# Patient Record
Sex: Male | Born: 1955
Health system: Southern US, Community
[De-identification: ages and names within clinical notes are randomized; demographics above are authoritative.]

## PROBLEM LIST (undated history)

## (undated) DIAGNOSIS — R1310 Dysphagia, unspecified: Secondary | ICD-10-CM

## (undated) DIAGNOSIS — I959 Hypotension, unspecified: Secondary | ICD-10-CM

## (undated) DIAGNOSIS — E559 Vitamin D deficiency, unspecified: Secondary | ICD-10-CM

## (undated) DIAGNOSIS — B029 Zoster without complications: Secondary | ICD-10-CM

## (undated) DIAGNOSIS — M6702 Short Achilles tendon (acquired), left ankle: Secondary | ICD-10-CM

## (undated) DIAGNOSIS — S82852B Displaced trimalleolar fracture of left lower leg, initial encounter for open fracture type I or II: Secondary | ICD-10-CM

## (undated) DIAGNOSIS — T8859XA Other complications of anesthesia, initial encounter: Secondary | ICD-10-CM

## (undated) DIAGNOSIS — R51 Headache: Secondary | ICD-10-CM

## (undated) DIAGNOSIS — M24572 Contracture, left ankle: Secondary | ICD-10-CM

## (undated) DIAGNOSIS — T4145XA Adverse effect of unspecified anesthetic, initial encounter: Secondary | ICD-10-CM

## (undated) DIAGNOSIS — R112 Nausea with vomiting, unspecified: Secondary | ICD-10-CM

## (undated) DIAGNOSIS — Z9889 Other specified postprocedural states: Secondary | ICD-10-CM

## (undated) DIAGNOSIS — J189 Pneumonia, unspecified organism: Secondary | ICD-10-CM

## (undated) DIAGNOSIS — R519 Headache, unspecified: Secondary | ICD-10-CM

## (undated) DIAGNOSIS — K219 Gastro-esophageal reflux disease without esophagitis: Secondary | ICD-10-CM

## (undated) DIAGNOSIS — A770 Spotted fever due to Rickettsia rickettsii: Secondary | ICD-10-CM

## (undated) DIAGNOSIS — E785 Hyperlipidemia, unspecified: Secondary | ICD-10-CM

## (undated) DIAGNOSIS — G009 Bacterial meningitis, unspecified: Secondary | ICD-10-CM

## (undated) DIAGNOSIS — A879 Viral meningitis, unspecified: Secondary | ICD-10-CM

## (undated) DIAGNOSIS — I499 Cardiac arrhythmia, unspecified: Secondary | ICD-10-CM

## (undated) DIAGNOSIS — G039 Meningitis, unspecified: Secondary | ICD-10-CM

## (undated) DIAGNOSIS — G960 Cerebrospinal fluid leak: Secondary | ICD-10-CM

## (undated) DIAGNOSIS — C801 Malignant (primary) neoplasm, unspecified: Secondary | ICD-10-CM

## (undated) DIAGNOSIS — S82302M Unspecified fracture of lower end of left tibia, subsequent encounter for open fracture type I or II with nonunion: Secondary | ICD-10-CM

## (undated) HISTORY — PX: LAMINECTOMY: SHX219

## (undated) HISTORY — DX: Cerebrospinal fluid leak: G96.0

## (undated) HISTORY — DX: Bacterial meningitis, unspecified: G00.9

## (undated) HISTORY — PX: ESOPHAGOGASTRODUODENOSCOPY: SHX1529

## (undated) HISTORY — DX: Spotted fever due to Rickettsia rickettsii: A77.0

## (undated) HISTORY — PX: KNEE ARTHROSCOPY WITH ANTERIOR CRUCIATE LIGAMENT (ACL) REPAIR: SHX5644

## (undated) HISTORY — DX: Viral meningitis, unspecified: A87.9

## (undated) HISTORY — PX: COLONOSCOPY: SHX174

## (undated) HISTORY — PX: ARTHROSCOPIC REPAIR ACL: SUR80

## (undated) HISTORY — PX: OTHER SURGICAL HISTORY: SHX169

## (undated) HISTORY — DX: Malignant (primary) neoplasm, unspecified: C80.1

---
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
## 2001-12-24 DIAGNOSIS — A879 Viral meningitis, unspecified: Secondary | ICD-10-CM

## 2001-12-24 HISTORY — DX: Viral meningitis, unspecified: A87.9

## 2004-12-24 DIAGNOSIS — G009 Bacterial meningitis, unspecified: Secondary | ICD-10-CM

## 2004-12-24 DIAGNOSIS — G9600 Cerebrospinal fluid leak, unspecified: Secondary | ICD-10-CM

## 2004-12-24 DIAGNOSIS — A770 Spotted fever due to Rickettsia rickettsii: Secondary | ICD-10-CM

## 2004-12-24 HISTORY — DX: Spotted fever due to Rickettsia rickettsii: A77.0

## 2004-12-24 HISTORY — DX: Bacterial meningitis, unspecified: G00.9

## 2004-12-24 HISTORY — DX: Cerebrospinal fluid leak, unspecified: G96.00

## 2005-08-25 ENCOUNTER — Emergency Department: Payer: Self-pay | Admitting: General Practice

## 2005-08-26 ENCOUNTER — Inpatient Hospital Stay: Payer: Self-pay

## 2005-09-12 ENCOUNTER — Ambulatory Visit: Payer: Self-pay | Admitting: Anesthesiology

## 2005-09-14 ENCOUNTER — Ambulatory Visit: Payer: Self-pay | Admitting: Anesthesiology

## 2007-04-08 ENCOUNTER — Ambulatory Visit: Payer: Self-pay | Admitting: General Practice

## 2007-04-29 ENCOUNTER — Ambulatory Visit: Payer: Self-pay | Admitting: General Practice

## 2007-07-10 ENCOUNTER — Ambulatory Visit: Payer: Self-pay | Admitting: General Practice

## 2008-01-20 ENCOUNTER — Ambulatory Visit: Payer: Self-pay | Admitting: General Practice

## 2008-03-12 ENCOUNTER — Encounter: Payer: Self-pay | Admitting: General Practice

## 2008-03-24 ENCOUNTER — Encounter: Payer: Self-pay | Admitting: General Practice

## 2008-04-23 ENCOUNTER — Encounter: Payer: Self-pay | Admitting: General Practice

## 2008-05-24 ENCOUNTER — Encounter: Payer: Self-pay | Admitting: General Practice

## 2009-02-25 ENCOUNTER — Ambulatory Visit: Payer: Self-pay | Admitting: Internal Medicine

## 2009-03-01 ENCOUNTER — Ambulatory Visit: Payer: Self-pay | Admitting: Internal Medicine

## 2009-03-11 ENCOUNTER — Ambulatory Visit: Payer: Self-pay | Admitting: General Practice

## 2009-03-16 ENCOUNTER — Ambulatory Visit: Payer: Self-pay | Admitting: General Practice

## 2009-03-16 ENCOUNTER — Ambulatory Visit: Payer: Self-pay | Admitting: Internal Medicine

## 2012-12-23 ENCOUNTER — Ambulatory Visit: Payer: Self-pay | Admitting: Family Medicine

## 2013-05-28 ENCOUNTER — Ambulatory Visit: Payer: Self-pay | Admitting: Orthopedic Surgery

## 2014-07-06 ENCOUNTER — Ambulatory Visit: Payer: Self-pay | Admitting: General Practice

## 2014-07-29 ENCOUNTER — Encounter: Payer: Self-pay | Admitting: *Deleted

## 2014-08-10 ENCOUNTER — Ambulatory Visit: Payer: Self-pay | Admitting: General Surgery

## 2014-08-16 ENCOUNTER — Ambulatory Visit (INDEPENDENT_AMBULATORY_CARE_PROVIDER_SITE_OTHER): Payer: BC Managed Care – PPO | Admitting: General Surgery

## 2014-08-16 ENCOUNTER — Encounter: Payer: Self-pay | Admitting: General Surgery

## 2014-08-16 VITALS — BP 116/80 | HR 68 | Resp 14 | Ht 71.5 in | Wt 190.0 lb

## 2014-08-16 DIAGNOSIS — G8929 Other chronic pain: Secondary | ICD-10-CM

## 2014-08-16 DIAGNOSIS — R1031 Right lower quadrant pain: Secondary | ICD-10-CM

## 2014-08-16 NOTE — Patient Instructions (Signed)
Patient advised to try Ibuprofen for 2 weeks to see if he has any relief. Patient to call with a report to our office in 2 weeks with an update of how he is doing. The patient is aware to call back for any questions or concerns.

## 2014-08-16 NOTE — Progress Notes (Signed)
Patient ID: Randy Bennett, male   DOB: 29-Oct-1956, 58 y.o.   MRN: [id number]  Chief Complaint  Patient presents with  . Abdominal Pain    HPI Randy Bennett is a 58 y.o. male here today for abdominal pain . Patient had a ct scan done on 07/06/14. He states the pain started approximately 3 months ago. The pain is located in the right lower quadrant just below his beltline. He states the pain comes and goes. He notices it with movement especially when bending over. He states it has gotten worse. He describes the pain as someone punching him in the stomach. He states it takes his breathe away at times. Bowels are moving normally. No weight loss.  No known injuries. 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   The patient is thought to a developed a CSF leak after multiple lumbar punctures during an episode of Bacterial meningitis in 2006. He subsequently was treated at [facility] Where he underwent 2 different laminectomies. By the patient's report, there is thought to be persistent leak from the L3 area. HPI  Past Medical History  Diagnosis Date  . Viral meningitis 2003  . Bacterial meningitis 2006  . [place] spotted fever 2006  . CSF leak 2006  . Hypertension   . Cancer     basil cell skin 2000-2001    Past Surgical History  Procedure Laterality Date  . Appendectomy  1970  . Laminectomy  [id number]  . Knee arthroscopy with anterior cruciate ligament (acl) repair Bilateral 1993 (L), 1992 (R)  . Skin cancer removal  2000-2001    Family History  Problem Relation Age of Onset  . Cancer Mother     Social History History  Substance Use Topics  . Smoking status: Never Smoker   . Smokeless tobacco: Never Used  . Alcohol Use: No    Allergies  Allergen Reactions  . Lodine [Etodolac] Rash  . Prednisolone Rash    Current Outpatient Prescriptions  Medication Sig Dispense Refill  . atorvastatin (LIPITOR) 20 MG tablet  Take 20 mg by mouth daily at 6 PM.      . cetirizine (ZYRTEC) 10 MG tablet Take 10 mg by mouth daily.      Marland Kitchen ibuprofen (ADVIL,MOTRIN) 800 MG tablet Take 800 mg by mouth every 8 (eight) hours as needed.      . Multiple Vitamin (MULTIVITAMIN) tablet Take 1 tablet by mouth daily.       No current facility-administered medications for this visit.    Review of Systems Review of Systems  Constitutional: Negative.   Respiratory: Negative.   Cardiovascular: Negative.   Gastrointestinal: Negative.     Blood pressure 116/80, pulse 68, resp. rate 14, height 5' 11.5" (1.816 m), weight 190 lb (86.183 kg).  Physical Exam Physical Exam  Constitutional: He is oriented to person, place, and time. He appears well-developed and well-nourished.  Neck: Neck supple. No thyromegaly present.  Cardiovascular: Normal rate, regular rhythm and normal heart sounds.   No murmur heard. Pulmonary/Chest: Effort normal and breath sounds normal.  Abdominal: Soft. Normal appearance and bowel sounds are normal. There is no hepatosplenomegaly. There is no tenderness. No hernia.  Lymphadenopathy:    He has no cervical adenopathy.  Neurological: He is alert and oriented to person, place, and time.  Skin: Skin is warm and dry.  The area of discomfort was marked both in the supine and standing position. This is just medial to  the right anterior superior iliac spine, well superior to the anticipated course of the inguinal canal. This is in the T10 nerve root distribution.  Data Reviewed The CT of the abdomen and pelvis on July 06, 2014 was reviewed with special attention to the right lower quadrant. No evidence of muscular defect, hernia or underlying mass could be appreciated. Coronary calcifications were identified. The patient has recently been started on Lipitor. (Cholesterol 177, tumors right 103, HDL low at 35. Elevated LDL of 121. Average cardiac risk.  Laboratory studies dated July 26, 2014 showed a normal Random  metabolic panel, CBC with a white blood cell count 6001 underwent normal differential, hemoglobin 15.2 and MCV of 91. Normal platelet count 264,000.  PCP note reviewed.  Assessment    Intermittent right lower quadrant pain without clear abnormality on clinical exam or recent imaging.     Plan    The patient has tolerated ibuprofen in the past. He makes use of about 800 mg daily for his headaches thought secondary to a chronic CSF leak. He is amenable to a trial of ibuprofen, 800 mg t.i.d. For 2 weeks to see if this improves his abdominal pain. He's been asked to give a phone followup in this regard. If he doesn't show significant improvement, consideration could be directed towards diagnostic laparoscopy. At this time, the patient is not excited about surgical intervention.    PCP: Jayme Cloud 08/17/2014, 2:35 PM

## 2014-08-17 DIAGNOSIS — R1031 Right lower quadrant pain: Principal | ICD-10-CM

## 2014-08-17 DIAGNOSIS — G8929 Other chronic pain: Secondary | ICD-10-CM | POA: Insufficient documentation

## 2015-05-31 DIAGNOSIS — R1310 Dysphagia, unspecified: Secondary | ICD-10-CM | POA: Insufficient documentation

## 2015-05-31 DIAGNOSIS — Z86010 Personal history of colonic polyps: Secondary | ICD-10-CM | POA: Insufficient documentation

## 2015-07-01 ENCOUNTER — Encounter
Admission: RE | Disposition: A | Discharge status: Home / Self Care | Payer: Self-pay | Source: Ambulatory Visit | Attending: Unknown Physician Specialty

## 2015-07-01 ENCOUNTER — Ambulatory Visit
Admission: RE | Admit: 2015-07-01 | Discharge: 2015-07-01 | Disposition: A | Discharge status: Home / Self Care | Payer: BLUE CROSS/BLUE SHIELD | Source: Ambulatory Visit | Attending: Unknown Physician Specialty | Admitting: Unknown Physician Specialty

## 2015-07-01 ENCOUNTER — Ambulatory Visit: Payer: BLUE CROSS/BLUE SHIELD | Admitting: Anesthesiology

## 2015-07-01 ENCOUNTER — Encounter: Payer: Self-pay | Admitting: *Deleted

## 2015-07-01 DIAGNOSIS — I10 Essential (primary) hypertension: Secondary | ICD-10-CM | POA: Diagnosis not present

## 2015-07-01 DIAGNOSIS — Z79899 Other long term (current) drug therapy: Secondary | ICD-10-CM | POA: Insufficient documentation

## 2015-07-01 DIAGNOSIS — Z888 Allergy status to other drugs, medicaments and biological substances status: Secondary | ICD-10-CM | POA: Diagnosis not present

## 2015-07-01 DIAGNOSIS — R1310 Dysphagia, unspecified: Secondary | ICD-10-CM | POA: Diagnosis not present

## 2015-07-01 DIAGNOSIS — D122 Benign neoplasm of ascending colon: Secondary | ICD-10-CM | POA: Diagnosis not present

## 2015-07-01 DIAGNOSIS — Z85828 Personal history of other malignant neoplasm of skin: Secondary | ICD-10-CM | POA: Diagnosis not present

## 2015-07-01 DIAGNOSIS — K210 Gastro-esophageal reflux disease with esophagitis: Secondary | ICD-10-CM | POA: Diagnosis not present

## 2015-07-01 DIAGNOSIS — Z86010 Personal history of colonic polyps: Secondary | ICD-10-CM | POA: Diagnosis present

## 2015-07-01 DIAGNOSIS — R197 Diarrhea, unspecified: Secondary | ICD-10-CM | POA: Insufficient documentation

## 2015-07-01 DIAGNOSIS — K640 First degree hemorrhoids: Secondary | ICD-10-CM | POA: Diagnosis not present

## 2015-07-01 HISTORY — DX: Meningitis, unspecified: G03.9

## 2015-07-01 HISTORY — PX: ESOPHAGOGASTRODUODENOSCOPY: SHX5428

## 2015-07-01 HISTORY — DX: Dysphagia, unspecified: R13.10

## 2015-07-01 HISTORY — DX: Headache: R51

## 2015-07-01 HISTORY — DX: Zoster without complications: B02.9

## 2015-07-01 HISTORY — PX: SAVORY DILATION: SHX5439

## 2015-07-01 HISTORY — DX: Headache, unspecified: R51.9

## 2015-07-01 HISTORY — PX: COLONOSCOPY WITH PROPOFOL: SHX5780

## 2015-07-01 SURGERY — COLONOSCOPY WITH PROPOFOL
Anesthesia: General

## 2015-07-01 MED ORDER — PROPOFOL 10 MG/ML IV BOLUS
INTRAVENOUS | Status: DC | PRN
  Administered 2015-07-01: 50 mg via INTRAVENOUS

## 2015-07-01 MED ORDER — BUTAMBEN-TETRACAINE-BENZOCAINE 2-2-14 % EX AERO
INHALATION_SPRAY | CUTANEOUS | Status: DC | PRN
  Administered 2015-07-01: 2 via TOPICAL

## 2015-07-01 MED ORDER — LIDOCAINE HCL (PF) 2 % IJ SOLN
INTRAMUSCULAR | Status: DC | PRN
  Administered 2015-07-01: 50 mg

## 2015-07-01 MED ORDER — SODIUM CHLORIDE 0.9 % IV SOLN
INTRAVENOUS | Status: DC
  Administered 2015-07-01: 1000 mL via INTRAVENOUS

## 2015-07-01 MED ORDER — FENTANYL CITRATE (PF) 100 MCG/2ML IJ SOLN
INTRAMUSCULAR | Status: DC | PRN
  Administered 2015-07-01: 50 ug via INTRAVENOUS

## 2015-07-01 MED ORDER — PROPOFOL INFUSION 10 MG/ML OPTIME
INTRAVENOUS | Status: DC | PRN
  Administered 2015-07-01: 140 ug/kg/min via INTRAVENOUS

## 2015-07-01 MED ORDER — SODIUM CHLORIDE 0.9 % IV SOLN: INTRAVENOUS | Status: DC

## 2015-07-01 MED ORDER — GLYCOPYRROLATE 0.2 MG/ML IJ SOLN
INTRAMUSCULAR | Status: DC | PRN
  Administered 2015-07-01: 0.2 mg via INTRAVENOUS

## 2015-07-01 MED ORDER — MIDAZOLAM HCL 5 MG/5ML IJ SOLN
INTRAMUSCULAR | Status: DC | PRN
  Administered 2015-07-01: 1 mg via INTRAVENOUS

## 2015-07-01 NOTE — Anesthesia Postprocedure Evaluation (Signed)
  Anesthesia Post-op Note  Patient: Randy Bennett  Procedure(s) Performed: Procedure(s): COLONOSCOPY WITH PROPOFOL (N/A) ESOPHAGOGASTRODUODENOSCOPY (EGD) (N/A) SAVORY DILATION (N/A)  Anesthesia type:General  Patient location: PACU  Post pain: Pain level controlled  Post assessment: Post-op Vital signs reviewed, Patient's Cardiovascular Status Stable, Respiratory Function Stable, Patent Airway and No signs of Nausea or vomiting  Post vital signs: Reviewed and stable  Last Vitals:  Filed Vitals:   07/01/15 1210  BP: 116/85  Pulse: 77  Temp:   Resp: 15    Level of consciousness: awake, alert  and patient cooperative  Complications: No apparent anesthesia complications

## 2015-07-01 NOTE — Op Note (Signed)
[facility] Gastroenterology Patient Name: Randy Bennett Procedure Date: 07/01/2015 10:59 AM MRN: [id number] Account #: [id number] Date of Birth: May 02, 1956 Admit Type: Outpatient Age: 59 Room: [facility] ENDO ROOM 1 Gender: Male Note Status: Finalized Procedure:         Colonoscopy Indications:       High risk colon cancer surveillance: Personal history of                     colonic polyps Providers:         Manya Silvas, MD Medicines:         Propofol per Anesthesia Complications:     No immediate complications. Procedure:         Pre-Anesthesia Assessment:                    - After reviewing the risks and benefits, the patient was                     deemed in satisfactory condition to undergo the procedure.                    After obtaining informed consent, the colonoscope was                     passed under direct vision. Throughout the procedure, the                     patient's blood pressure, pulse, and oxygen saturations                     were monitored continuously. The Colonoscope was                     introduced through the anus and advanced to the the cecum,                     identified by appendiceal orifice and ileocecal valve. The                     colonoscopy was performed without difficulty. The patient                     tolerated the procedure well. The quality of the bowel                     preparation was excellent. Findings:      A diminutive polyp was found in the ascending colon. The polyp was       sessile. The polyp was removed with a jumbo cold forceps. Resection and       retrieval were complete.      Internal hemorrhoids were found during endoscopy. The hemorrhoids were       small, medium-sized and Grade I (internal hemorrhoids that do not       prolapse).      The exam was otherwise without abnormality. Impression:        - One diminutive polyp in the ascending colon. Resected                     and  retrieved.                    - Internal hemorrhoids.                    -  The examination was otherwise normal. Recommendation:    - Await pathology results. Probably repeat in 5 years.                     Continue Imodium as needed. Manya Silvas, MD 07/01/2015 11:48:25 AM This report has been signed electronically. Number of Addenda: 0 Note Initiated On: 07/01/2015 10:59 AM Scope Withdrawal Time: 0 hours 9 minutes 46 seconds  Total Procedure Duration: 0 hours 16 minutes 59 seconds       [facility]

## 2015-07-01 NOTE — H&P (Signed)
Primary Care Physician:  Everlean Alstrom, MD Primary Gastroenterologist:  Dr. Vira Agar  Pre-Procedure History & Physical: HPI:  Randy Bennett is a 59 y.o. male is here for an endoscopy and colonoscopy.   Past Medical History  Diagnosis Date  . Viral meningitis 2003  . Bacterial meningitis 2006  . Rocky Mountain spotted fever 2006  . CSF leak 2006  . Hypertension   . Cancer     basil cell skin 2000-2001  . Headache   . Shingles   . Meningitis   . Dysphagia     Past Surgical History  Procedure Laterality Date  . Appendectomy  1970  . Laminectomy  [id number]  . Knee arthroscopy with anterior cruciate ligament (acl) repair Bilateral 1993 (L), 1992 (R)  . Skin cancer removal  2000-2001  . Colonoscopy    . Esophagogastroduodenoscopy      Prior to Admission medications   Medication Sig Start Date End Date Taking? Authorizing Provider  diazepam (VALIUM) 5 MG tablet Take 5 mg by mouth every 6 (six) hours as needed for anxiety.   Yes Historical Provider, MD  HYDROmorphone (DILAUDID) 2 MG tablet Take 2 mg by mouth every 4 (four) hours as needed for severe pain.   Yes Historical Provider, MD  ondansetron (ZOFRAN) 4 MG tablet Take 4 mg by mouth every 8 (eight) hours as needed for nausea or vomiting.   Yes Historical Provider, MD  atorvastatin (LIPITOR) 20 MG tablet Take 20 mg by mouth daily at 6 PM.    Historical Provider, MD  cetirizine (ZYRTEC) 10 MG tablet Take 10 mg by mouth daily.    Historical Provider, MD  ibuprofen (ADVIL,MOTRIN) 800 MG tablet Take 800 mg by mouth every 8 (eight) hours as needed.    Historical Provider, MD  Multiple Vitamin (MULTIVITAMIN) tablet Take 1 tablet by mouth daily.    Historical Provider, MD    Allergies as of 06/13/2015 - Review Complete 08/16/2014  Allergen Reaction Noted  . Lodine [etodolac] Rash 08/16/2014  . Prednisolone Rash 08/16/2014    Family History  Problem Relation Age of Onset  . Cancer Mother     History   Social  History  . Marital Status: Married    Spouse Name: N/A  . Number of Children: N/A  . Years of Education: N/A   Occupational History  . Not on file.   Social History Main Topics  . Smoking status: Never Smoker   . Smokeless tobacco: Never Used  . Alcohol Use: No  . Drug Use: No  . Sexual Activity: Not on file   Other Topics Concern  . Not on file   Social History Narrative    Review of Systems: See HPI, otherwise negative ROS  Physical Exam: BP 132/86 mmHg  Pulse 83  Temp(Src) 98.3 F (36.8 C) (Tympanic)  Resp 21  Ht 5\' 11"  (1.803 m)  Wt 83.915 kg (185 lb)  BMI 25.81 kg/m2  SpO2 100% General:   Alert,  pleasant and cooperative in NAD Head:  Normocephalic and atraumatic. Neck:  Supple; no masses or thyromegaly. Lungs:  Clear throughout to auscultation.    Heart:  Regular rate and rhythm. Abdomen:  Soft, nontender and nondistended. Normal bowel sounds, without guarding, and without rebound.   Neurologic:  Alert and  oriented x4;  grossly normal neurologically.  Impression/Plan: Randy Bennett is here for an endoscopy and colonoscopy to be performed for dysphagia and previous adenoma and diarrhea.  Risks, benefits, limitations, and alternatives regarding  endoscopy and colonoscopy have been reviewed with the patient.  Questions have been answered.  All parties agreeable.   Gaylyn Cheers, MD  07/01/2015, 11:11 AM   Primary Care Physician:  Everlean Alstrom, MD Primary Gastroenterologist:  Dr. Vira Agar  Pre-Procedure History & Physical: HPI:  Randy Bennett is a 59 y.o. male is here for an endoscopy and colonoscopy.   Past Medical History  Diagnosis Date  . Viral meningitis 2003  . Bacterial meningitis 2006  . Rocky Mountain spotted fever 2006  . CSF leak 2006  . Hypertension   . Cancer     basil cell skin 2000-2001  . Headache   . Shingles   . Meningitis   . Dysphagia     Past Surgical History  Procedure Laterality Date  . Appendectomy   1970  . Laminectomy  [id number]  . Knee arthroscopy with anterior cruciate ligament (acl) repair Bilateral 1993 (L), 1992 (R)  . Skin cancer removal  2000-2001  . Colonoscopy    . Esophagogastroduodenoscopy      Prior to Admission medications   Medication Sig Start Date End Date Taking? Authorizing Provider  diazepam (VALIUM) 5 MG tablet Take 5 mg by mouth every 6 (six) hours as needed for anxiety.   Yes Historical Provider, MD  HYDROmorphone (DILAUDID) 2 MG tablet Take 2 mg by mouth every 4 (four) hours as needed for severe pain.   Yes Historical Provider, MD  ondansetron (ZOFRAN) 4 MG tablet Take 4 mg by mouth every 8 (eight) hours as needed for nausea or vomiting.   Yes Historical Provider, MD  atorvastatin (LIPITOR) 20 MG tablet Take 20 mg by mouth daily at 6 PM.    Historical Provider, MD  cetirizine (ZYRTEC) 10 MG tablet Take 10 mg by mouth daily.    Historical Provider, MD  ibuprofen (ADVIL,MOTRIN) 800 MG tablet Take 800 mg by mouth every 8 (eight) hours as needed.    Historical Provider, MD  Multiple Vitamin (MULTIVITAMIN) tablet Take 1 tablet by mouth daily.    Historical Provider, MD    Allergies as of 06/13/2015 - Review Complete 08/16/2014  Allergen Reaction Noted  . Lodine [etodolac] Rash 08/16/2014  . Prednisolone Rash 08/16/2014    Family History  Problem Relation Age of Onset  . Cancer Mother     History   Social History  . Marital Status: Married    Spouse Name: N/A  . Number of Children: N/A  . Years of Education: N/A   Occupational History  . Not on file.   Social History Main Topics  . Smoking status: Never Smoker   . Smokeless tobacco: Never Used  . Alcohol Use: No  . Drug Use: No  . Sexual Activity: Not on file   Other Topics Concern  . Not on file   Social History Narrative    Review of Systems: See HPI, otherwise negative ROS  Physical Exam: BP 132/86 mmHg  Pulse 83  Temp(Src) 98.3 F (36.8 C) (Tympanic)  Resp 21  Ht 5\' 11"  (1.803  m)  Wt 83.915 kg (185 lb)  BMI 25.81 kg/m2  SpO2 100% General:   Alert,  pleasant and cooperative in NAD Head:  Normocephalic and atraumatic. Neck:  Supple; no masses or thyromegaly. Lungs:  Clear throughout to auscultation.    Heart:  Regular rate and rhythm. Abdomen:  Soft, nontender and nondistended. Normal bowel sounds, without guarding, and without rebound.   Neurologic:  Alert and  oriented x4;  grossly normal neurologically.  Impression/Plan: Randy Bennett is here for an endoscopy and colonoscopy to be performed for for previous colon polyps and diarrhea and dysphagia.  Risks, benefits, limitations, and alternatives regarding  endoscopy and colonoscopy have been reviewed with the patient.  Questions have been answered.  All parties agreeable.   Gaylyn Cheers, MD  07/01/2015, 11:11 AM

## 2015-07-01 NOTE — Anesthesia Preprocedure Evaluation (Signed)
Anesthesia Evaluation  Patient identified by MRN, date of birth, ID band Patient awake    Reviewed: Allergy & Precautions, NPO status , Patient's Chart, lab work & pertinent test results  Airway Mallampati: I  TM Distance: >3 FB Neck ROM: Full    Dental  (+) Teeth Intact   Pulmonary    Pulmonary exam normal       Cardiovascular negative cardio ROS Normal cardiovascular exam    Neuro/Psych Chronic headaches since presenting with meningitis secondary to rocky mountain spotted fever. He has a persistent CSF leak.    GI/Hepatic   Endo/Other    Renal/GU      Musculoskeletal   Abdominal Normal abdominal exam  (+)   Peds  Hematology   Anesthesia Other Findings   Reproductive/Obstetrics                             Anesthesia Physical Anesthesia Plan  ASA: II  Anesthesia Plan: General   Post-op Pain Management:    Induction: Intravenous  Airway Management Planned: Nasal Cannula  Additional Equipment:   Intra-op Plan:   Post-operative Plan:   Informed Consent: I have reviewed the patients History and Physical, chart, labs and discussed the procedure including the risks, benefits and alternatives for the proposed anesthesia with the patient or authorized representative who has indicated his/her understanding and acceptance.     Plan Discussed with: CRNA  Anesthesia Plan Comments:         Anesthesia Quick Evaluation

## 2015-07-01 NOTE — Transfer of Care (Signed)
Immediate Anesthesia Transfer of Care Note  Patient: Randy Bennett  Procedure(s) Performed: Procedure(s): COLONOSCOPY WITH PROPOFOL (N/A) ESOPHAGOGASTRODUODENOSCOPY (EGD) (N/A) SAVORY DILATION (N/A)  Patient Location: PACU  Anesthesia Type:General  Level of Consciousness: sedated  Airway & Oxygen Therapy: Patient Spontanous Breathing and Patient connected to nasal cannula oxygen  Post-op Assessment: Report given to RN and Post -op Vital signs reviewed and stable  Post vital signs: Reviewed and stable  Last Vitals:  Filed Vitals:   07/01/15 1036  BP: 132/86  Pulse: 83  Temp: 36.8 C  Resp: 21    Complications: No apparent anesthesia complications

## 2015-07-01 NOTE — Op Note (Signed)
[facility] Gastroenterology Patient Name: Randy Bennett Procedure Date: 07/01/2015 11:06 AM MRN: [id number] Account #: [id number] Date of Birth: 18-Apr-1956 Admit Type: Outpatient Age: 59 Room: [facility] ENDO ROOM 1 Gender: Male Note Status: Finalized Procedure:         Upper GI endoscopy Indications:       Dysphagia Providers:         Manya Silvas, MD Medicines:         Propofol per Anesthesia Complications:     No immediate complications. Procedure:         Pre-Anesthesia Assessment:                    - After reviewing the risks and benefits, the patient was                     deemed in satisfactory condition to undergo the procedure.                    After obtaining informed consent, the endoscope was passed                     under direct vision. Throughout the procedure, the                     patient's blood pressure, pulse, and oxygen saturations                     were monitored continuously. The Endoscope was introduced                     through the mouth, and advanced to the second part of                     duodenum. The upper GI endoscopy was accomplished without                     difficulty. The patient tolerated the procedure well. Findings:      Esophagitis with no bleeding was found 42-44 cm from the incisors.       Biopsies were taken with a cold forceps for histology. to rule out       Barretts esophagus.      The stomach was normal.      The examined duodenum was normal. Impression:        - Reflux esophagitis. Rule out Barrett's esophagus.                     Biopsied.                    - Normal stomach.                    - Normal examined duodenum. Recommendation:    - Await pathology results. Manya Silvas, MD 07/01/2015 11:25:30 AM This report has been signed electronically. Number of Addenda: 0 Note Initiated On: 07/01/2015 11:06 AM      [facility]

## 2015-07-04 ENCOUNTER — Encounter: Payer: Self-pay | Admitting: Unknown Physician Specialty

## 2015-07-04 LAB — SURGICAL PATHOLOGY

## 2015-11-08 ENCOUNTER — Other Ambulatory Visit: Payer: Self-pay | Admitting: Physician Assistant

## 2015-11-08 DIAGNOSIS — G9600 Cerebrospinal fluid leak, unspecified: Secondary | ICD-10-CM

## 2015-11-08 DIAGNOSIS — R51 Headache: Principal | ICD-10-CM

## 2015-11-08 DIAGNOSIS — R519 Headache, unspecified: Secondary | ICD-10-CM

## 2015-11-08 DIAGNOSIS — G8929 Other chronic pain: Secondary | ICD-10-CM

## 2015-11-22 ENCOUNTER — Encounter (HOSPITAL_COMMUNITY)
Admission: EM | Disposition: A | Discharge status: Home / Self Care | Payer: Self-pay | Source: Home / Self Care | Attending: Orthopedic Surgery

## 2015-11-22 ENCOUNTER — Emergency Department (HOSPITAL_COMMUNITY): Payer: BLUE CROSS/BLUE SHIELD | Admitting: Anesthesiology

## 2015-11-22 ENCOUNTER — Encounter (HOSPITAL_COMMUNITY): Payer: Self-pay | Admitting: *Deleted

## 2015-11-22 ENCOUNTER — Inpatient Hospital Stay (HOSPITAL_COMMUNITY)
Admission: EM | Admit: 2015-11-22 | Discharge: 2015-11-30 | DRG: 493 | Disposition: A | Discharge status: Home / Self Care | Payer: BLUE CROSS/BLUE SHIELD | Attending: Orthopedic Surgery | Admitting: Orthopedic Surgery

## 2015-11-22 ENCOUNTER — Emergency Department (HOSPITAL_COMMUNITY): Payer: BLUE CROSS/BLUE SHIELD

## 2015-11-22 ENCOUNTER — Inpatient Hospital Stay (HOSPITAL_COMMUNITY): Payer: BLUE CROSS/BLUE SHIELD

## 2015-11-22 DIAGNOSIS — I10 Essential (primary) hypertension: Secondary | ICD-10-CM | POA: Diagnosis present

## 2015-11-22 DIAGNOSIS — G9600 Cerebrospinal fluid leak, unspecified: Secondary | ICD-10-CM | POA: Diagnosis present

## 2015-11-22 DIAGNOSIS — E785 Hyperlipidemia, unspecified: Secondary | ICD-10-CM | POA: Diagnosis present

## 2015-11-22 DIAGNOSIS — S82873A Displaced pilon fracture of unspecified tibia, initial encounter for closed fracture: Secondary | ICD-10-CM

## 2015-11-22 DIAGNOSIS — Z8661 Personal history of infections of the central nervous system: Secondary | ICD-10-CM

## 2015-11-22 DIAGNOSIS — S82899B Other fracture of unspecified lower leg, initial encounter for open fracture type I or II: Secondary | ICD-10-CM | POA: Diagnosis present

## 2015-11-22 DIAGNOSIS — S82202B Unspecified fracture of shaft of left tibia, initial encounter for open fracture type I or II: Secondary | ICD-10-CM

## 2015-11-22 DIAGNOSIS — B370 Candidal stomatitis: Secondary | ICD-10-CM | POA: Diagnosis not present

## 2015-11-22 DIAGNOSIS — I808 Phlebitis and thrombophlebitis of other sites: Secondary | ICD-10-CM | POA: Diagnosis present

## 2015-11-22 DIAGNOSIS — Z23 Encounter for immunization: Secondary | ICD-10-CM | POA: Diagnosis not present

## 2015-11-22 DIAGNOSIS — Z888 Allergy status to other drugs, medicaments and biological substances status: Secondary | ICD-10-CM | POA: Diagnosis not present

## 2015-11-22 DIAGNOSIS — R52 Pain, unspecified: Secondary | ICD-10-CM

## 2015-11-22 DIAGNOSIS — K219 Gastro-esophageal reflux disease without esophagitis: Secondary | ICD-10-CM | POA: Diagnosis present

## 2015-11-22 DIAGNOSIS — W1789XA Other fall from one level to another, initial encounter: Secondary | ICD-10-CM

## 2015-11-22 DIAGNOSIS — R339 Retention of urine, unspecified: Secondary | ICD-10-CM | POA: Diagnosis not present

## 2015-11-22 DIAGNOSIS — E559 Vitamin D deficiency, unspecified: Secondary | ICD-10-CM | POA: Diagnosis present

## 2015-11-22 DIAGNOSIS — Z419 Encounter for procedure for purposes other than remedying health state, unspecified: Secondary | ICD-10-CM

## 2015-11-22 DIAGNOSIS — D62 Acute posthemorrhagic anemia: Secondary | ICD-10-CM | POA: Diagnosis not present

## 2015-11-22 DIAGNOSIS — S82302B Unspecified fracture of lower end of left tibia, initial encounter for open fracture type I or II: Secondary | ICD-10-CM | POA: Diagnosis present

## 2015-11-22 DIAGNOSIS — W11XXXA Fall on and from ladder, initial encounter: Secondary | ICD-10-CM | POA: Diagnosis present

## 2015-11-22 DIAGNOSIS — Z79899 Other long term (current) drug therapy: Secondary | ICD-10-CM | POA: Diagnosis not present

## 2015-11-22 DIAGNOSIS — S82892B Other fracture of left lower leg, initial encounter for open fracture type I or II: Secondary | ICD-10-CM

## 2015-11-22 DIAGNOSIS — S82872A Displaced pilon fracture of left tibia, initial encounter for closed fracture: Secondary | ICD-10-CM

## 2015-11-22 DIAGNOSIS — S82852B Displaced trimalleolar fracture of left lower leg, initial encounter for open fracture type I or II: Secondary | ICD-10-CM | POA: Diagnosis present

## 2015-11-22 HISTORY — DX: Displaced trimalleolar fracture of left lower leg, initial encounter for open fracture type I or II: S82.852B

## 2015-11-22 HISTORY — DX: Hyperlipidemia, unspecified: E78.5

## 2015-11-22 HISTORY — PX: EXTERNAL FIXATION LEG: SHX1549

## 2015-11-22 HISTORY — DX: Gastro-esophageal reflux disease without esophagitis: K21.9

## 2015-11-22 HISTORY — DX: Vitamin D deficiency, unspecified: E55.9

## 2015-11-22 HISTORY — PX: ANKLE FRACTURE SURGERY: SHX122

## 2015-11-22 LAB — CBC
HCT: 44 % (ref 39.0–52.0)
Hemoglobin: 14.9 g/dL (ref 13.0–17.0)
MCH: 30.6 pg (ref 26.0–34.0)
MCHC: 33.9 g/dL (ref 30.0–36.0)
MCV: 90.3 fL (ref 78.0–100.0)
Platelets: 285 10*3/uL (ref 150–400)
RBC: 4.87 MIL/uL (ref 4.22–5.81)
RDW: 12.8 % (ref 11.5–15.5)
WBC: 14.4 10*3/uL — ABNORMAL HIGH (ref 4.0–10.5)

## 2015-11-22 LAB — BASIC METABOLIC PANEL
Anion gap: 11 (ref 5–15)
BUN: 17 mg/dL (ref 6–20)
CO2: 21 mmol/L — ABNORMAL LOW (ref 22–32)
Calcium: 9.3 mg/dL (ref 8.9–10.3)
Chloride: 104 mmol/L (ref 101–111)
Creatinine, Ser: 1.55 mg/dL — ABNORMAL HIGH (ref 0.61–1.24)
GFR calc Af Amer: 55 mL/min — ABNORMAL LOW (ref >60)
GFR calc non Af Amer: 48 mL/min — ABNORMAL LOW (ref >60)
Glucose, Bld: 149 mg/dL — ABNORMAL HIGH (ref 65–99)
Potassium: 3.4 mmol/L — ABNORMAL LOW (ref 3.5–5.1)
Sodium: 136 mmol/L (ref 135–145)

## 2015-11-22 LAB — TYPE AND SCREEN
ABO/RH(D): O POS
Antibody Screen: NEGATIVE

## 2015-11-22 LAB — PROTIME-INR
INR: 1.12 (ref 0.00–1.49)
Prothrombin Time: 14.6 seconds (ref 11.6–15.2)

## 2015-11-22 LAB — ABO/RH: ABO/RH(D): O POS

## 2015-11-22 SURGERY — EXTERNAL FIXATION, LOWER EXTREMITY
Anesthesia: General | Site: Leg Lower | Laterality: Left

## 2015-11-22 MED ORDER — FENTANYL CITRATE (PF) 100 MCG/2ML IJ SOLN
INTRAMUSCULAR | Status: AC
  Filled 2015-11-22: qty 2

## 2015-11-22 MED ORDER — ONDANSETRON HCL 4 MG/2ML IJ SOLN
INTRAMUSCULAR | Status: DC | PRN
  Administered 2015-11-22: 4 mg via INTRAVENOUS

## 2015-11-22 MED ORDER — HYDROMORPHONE HCL 1 MG/ML IJ SOLN
INTRAMUSCULAR | Status: AC
  Administered 2015-11-22: 1 mg
  Filled 2015-11-22: qty 1

## 2015-11-22 MED ORDER — CEFAZOLIN SODIUM-DEXTROSE 2-3 GM-% IV SOLR
2.0000 g | Freq: Once | INTRAVENOUS | Status: AC
  Administered 2015-11-22: 2 g via INTRAVENOUS
  Filled 2015-11-22: qty 50

## 2015-11-22 MED ORDER — ONDANSETRON HCL 4 MG/2ML IJ SOLN
INTRAMUSCULAR | Status: AC
  Filled 2015-11-22: qty 2

## 2015-11-22 MED ORDER — LACTATED RINGERS IV SOLN
INTRAVENOUS | Status: DC
  Administered 2015-11-23 – 2015-11-24 (×2): via INTRAVENOUS

## 2015-11-22 MED ORDER — ONDANSETRON HCL 4 MG PO TABS: 4.0000 mg | ORAL_TABLET | Freq: Three times a day (TID) | ORAL | Status: DC | PRN

## 2015-11-22 MED ORDER — ROCURONIUM BROMIDE 50 MG/5ML IV SOLN
INTRAVENOUS | Status: AC
  Filled 2015-11-22: qty 1

## 2015-11-22 MED ORDER — SUFENTANIL CITRATE 50 MCG/ML IV SOLN
INTRAVENOUS | Status: DC | PRN
  Administered 2015-11-22: 10 ug via INTRAVENOUS

## 2015-11-22 MED ORDER — FENTANYL CITRATE (PF) 100 MCG/2ML IJ SOLN
25.0000 ug | INTRAMUSCULAR | Status: DC | PRN
  Administered 2015-11-22 (×3): 50 ug via INTRAVENOUS
  Filled 2015-11-22 (×2): qty 1

## 2015-11-22 MED ORDER — ACETAMINOPHEN 650 MG RE SUPP: 650.0000 mg | Freq: Four times a day (QID) | RECTAL | Status: DC | PRN

## 2015-11-22 MED ORDER — CEFAZOLIN SODIUM-DEXTROSE 2-3 GM-% IV SOLR: 2.0000 g | INTRAVENOUS | Status: DC

## 2015-11-22 MED ORDER — MIDAZOLAM HCL 2 MG/2ML IJ SOLN
INTRAMUSCULAR | Status: AC
  Filled 2015-11-22: qty 2

## 2015-11-22 MED ORDER — NEOSTIGMINE METHYLSULFATE 10 MG/10ML IV SOLN
INTRAVENOUS | Status: DC | PRN
  Administered 2015-11-22: 3 mg via INTRAVENOUS

## 2015-11-22 MED ORDER — LACTATED RINGERS IV SOLN
INTRAVENOUS | Status: DC | PRN
  Administered 2015-11-22 (×2): via INTRAVENOUS

## 2015-11-22 MED ORDER — HYDROMORPHONE HCL 2 MG PO TABS
2.0000 mg | ORAL_TABLET | Freq: Once | ORAL | Status: AC
  Administered 2015-11-22: 2 mg via ORAL

## 2015-11-22 MED ORDER — DEXAMETHASONE SODIUM PHOSPHATE 4 MG/ML IJ SOLN
INTRAMUSCULAR | Status: AC
  Filled 2015-11-22: qty 1

## 2015-11-22 MED ORDER — ATORVASTATIN CALCIUM 20 MG PO TABS
20.0000 mg | ORAL_TABLET | Freq: Every day | ORAL | Status: DC
  Administered 2015-11-23 – 2015-11-29 (×6): 20 mg via ORAL
  Filled 2015-11-22 (×6): qty 1

## 2015-11-22 MED ORDER — FENTANYL CITRATE (PF) 100 MCG/2ML IJ SOLN
INTRAMUSCULAR | Status: AC
  Administered 2015-11-22: 16:00:00
  Filled 2015-11-22: qty 2

## 2015-11-22 MED ORDER — HYDROMORPHONE HCL 1 MG/ML IJ SOLN
1.0000 mg | Freq: Once | INTRAMUSCULAR | Status: AC
  Administered 2015-11-22: 1 mg via INTRAVENOUS
  Filled 2015-11-22: qty 1

## 2015-11-22 MED ORDER — OXYCODONE HCL 5 MG PO TABS
ORAL_TABLET | ORAL | Status: AC
  Administered 2015-11-22: 5 mg
  Filled 2015-11-22: qty 1

## 2015-11-22 MED ORDER — EPHEDRINE SULFATE 50 MG/ML IJ SOLN
INTRAMUSCULAR | Status: DC | PRN
  Administered 2015-11-22: 10 mg via INTRAVENOUS

## 2015-11-22 MED ORDER — LIDOCAINE HCL (CARDIAC) 20 MG/ML IV SOLN
INTRAVENOUS | Status: DC | PRN
  Administered 2015-11-22: 100 mg via INTRAVENOUS

## 2015-11-22 MED ORDER — PROPOFOL 10 MG/ML IV BOLUS
INTRAVENOUS | Status: AC
  Administered 2015-11-22: 70 mg via INTRAVENOUS
  Filled 2015-11-22: qty 20

## 2015-11-22 MED ORDER — SODIUM CHLORIDE 0.9 % IR SOLN
Status: DC | PRN
  Administered 2015-11-22: 3000 mL

## 2015-11-22 MED ORDER — FENTANYL CITRATE (PF) 250 MCG/5ML IJ SOLN
INTRAMUSCULAR | Status: AC
  Filled 2015-11-22: qty 5

## 2015-11-22 MED ORDER — ONDANSETRON HCL 4 MG/2ML IJ SOLN
4.0000 mg | Freq: Once | INTRAMUSCULAR | Status: AC
  Administered 2015-11-22: 4 mg via INTRAVENOUS
  Filled 2015-11-22: qty 2

## 2015-11-22 MED ORDER — EPHEDRINE SULFATE 50 MG/ML IJ SOLN
INTRAMUSCULAR | Status: AC
  Filled 2015-11-22: qty 1

## 2015-11-22 MED ORDER — HYDROMORPHONE HCL 1 MG/ML IJ SOLN
1.0000 mg | INTRAMUSCULAR | Status: DC | PRN
  Administered 2015-11-22: 2 mg via INTRAVENOUS
  Administered 2015-11-23: 1 mg via INTRAVENOUS
  Administered 2015-11-23 (×2): 2 mg via INTRAVENOUS
  Administered 2015-11-24: 1 mg via INTRAVENOUS
  Administered 2015-11-24 – 2015-11-25 (×3): 2 mg via INTRAVENOUS
  Administered 2015-11-28 (×2): 1 mg via INTRAVENOUS
  Administered 2015-11-28 (×2): 2 mg via INTRAVENOUS
  Administered 2015-11-29: 1 mg via INTRAVENOUS
  Filled 2015-11-22 (×2): qty 2
  Filled 2015-11-22: qty 1
  Filled 2015-11-22: qty 2
  Filled 2015-11-22: qty 1
  Filled 2015-11-22 (×3): qty 2
  Filled 2015-11-22: qty 1
  Filled 2015-11-22 (×2): qty 2
  Filled 2015-11-22: qty 1
  Filled 2015-11-22: qty 2

## 2015-11-22 MED ORDER — FENTANYL CITRATE (PF) 100 MCG/2ML IJ SOLN
25.0000 ug | INTRAMUSCULAR | Status: DC | PRN
  Administered 2015-11-22 (×3): 50 ug via INTRAVENOUS

## 2015-11-22 MED ORDER — SUFENTANIL CITRATE 50 MCG/ML IV SOLN
INTRAVENOUS | Status: AC
  Filled 2015-11-22: qty 1

## 2015-11-22 MED ORDER — LORATADINE 10 MG PO TABS
10.0000 mg | ORAL_TABLET | Freq: Every day | ORAL | Status: DC
Start: 2015-11-23 — End: 2015-11-30
  Administered 2015-11-28: 10 mg via ORAL
  Filled 2015-11-22 (×7): qty 1

## 2015-11-22 MED ORDER — TEMAZEPAM 15 MG PO CAPS
15.0000 mg | ORAL_CAPSULE | Freq: Every evening | ORAL | Status: DC | PRN
  Administered 2015-11-23: 30 mg via ORAL
  Filled 2015-11-22: qty 2

## 2015-11-22 MED ORDER — ONDANSETRON HCL 4 MG PO TABS: 4.0000 mg | ORAL_TABLET | Freq: Four times a day (QID) | ORAL | Status: DC | PRN

## 2015-11-22 MED ORDER — HYDROMORPHONE HCL 1 MG/ML IJ SOLN
INTRAMUSCULAR | Status: AC
  Filled 2015-11-22: qty 1

## 2015-11-22 MED ORDER — LACTATED RINGERS IV SOLN
INTRAVENOUS | Status: DC
  Administered 2015-11-22: 19:00:00 via INTRAVENOUS

## 2015-11-22 MED ORDER — ROCURONIUM BROMIDE 100 MG/10ML IV SOLN
INTRAVENOUS | Status: DC | PRN
  Administered 2015-11-22: 30 mg via INTRAVENOUS

## 2015-11-22 MED ORDER — GLYCOPYRROLATE 0.2 MG/ML IJ SOLN
INTRAMUSCULAR | Status: DC | PRN
  Administered 2015-11-22: 0.4 mg via INTRAVENOUS

## 2015-11-22 MED ORDER — SODIUM CHLORIDE 0.9 % IJ SOLN
INTRAMUSCULAR | Status: AC
  Filled 2015-11-22: qty 10

## 2015-11-22 MED ORDER — GLYCOPYRROLATE 0.2 MG/ML IJ SOLN
INTRAMUSCULAR | Status: AC
  Filled 2015-11-22: qty 2

## 2015-11-22 MED ORDER — ONDANSETRON HCL 4 MG/2ML IJ SOLN
4.0000 mg | Freq: Four times a day (QID) | INTRAMUSCULAR | Status: DC | PRN
  Administered 2015-11-22: 4 mg via INTRAVENOUS
  Filled 2015-11-22: qty 2

## 2015-11-22 MED ORDER — ACETAMINOPHEN 325 MG PO TABS: 650.0000 mg | ORAL_TABLET | Freq: Four times a day (QID) | ORAL | Status: DC | PRN

## 2015-11-22 MED ORDER — DIAZEPAM 5 MG PO TABS
5.0000 mg | ORAL_TABLET | Freq: Four times a day (QID) | ORAL | Status: DC | PRN
  Administered 2015-11-28 – 2015-11-30 (×7): 5 mg via ORAL
  Filled 2015-11-22 (×7): qty 1

## 2015-11-22 MED ORDER — OXYCODONE HCL 5 MG/5ML PO SOLN: 5.0000 mg | Freq: Once | ORAL | Status: DC | PRN

## 2015-11-22 MED ORDER — METHOCARBAMOL 500 MG PO TABS
500.0000 mg | ORAL_TABLET | Freq: Four times a day (QID) | ORAL | Status: DC | PRN
  Administered 2015-11-23 – 2015-11-24 (×4): 500 mg via ORAL
  Filled 2015-11-22 (×4): qty 1

## 2015-11-22 MED ORDER — ENOXAPARIN SODIUM 40 MG/0.4ML ~~LOC~~ SOLN
40.0000 mg | SUBCUTANEOUS | Status: DC
  Administered 2015-11-23 – 2015-11-24 (×2): 40 mg via SUBCUTANEOUS
  Filled 2015-11-22 (×2): qty 0.4

## 2015-11-22 MED ORDER — ONDANSETRON HCL 4 MG/2ML IJ SOLN
4.0000 mg | Freq: Once | INTRAMUSCULAR | Status: AC | PRN
  Administered 2015-11-22: 4 mg via INTRAVENOUS

## 2015-11-22 MED ORDER — SUCCINYLCHOLINE CHLORIDE 20 MG/ML IJ SOLN
INTRAMUSCULAR | Status: DC | PRN
  Administered 2015-11-22: 100 mg via INTRAVENOUS

## 2015-11-22 MED ORDER — FENTANYL CITRATE (PF) 100 MCG/2ML IJ SOLN
INTRAMUSCULAR | Status: DC | PRN
  Administered 2015-11-22 (×2): 100 ug via INTRAVENOUS
  Administered 2015-11-22: 50 ug via INTRAVENOUS

## 2015-11-22 MED ORDER — METHOCARBAMOL 1000 MG/10ML IJ SOLN
500.0000 mg | Freq: Four times a day (QID) | INTRAVENOUS | Status: DC | PRN
  Administered 2015-11-23: 500 mg via INTRAVENOUS
  Filled 2015-11-22 (×3): qty 5

## 2015-11-22 MED ORDER — HYDROMORPHONE HCL 1 MG/ML IJ SOLN
INTRAMUSCULAR | Status: AC | PRN
  Administered 2015-11-22 (×2): 1 mg via INTRAVENOUS

## 2015-11-22 MED ORDER — HYDROMORPHONE HCL 2 MG PO TABS
ORAL_TABLET | ORAL | Status: AC
  Filled 2015-11-22: qty 1

## 2015-11-22 MED ORDER — SODIUM CHLORIDE 0.9 % IV BOLUS (SEPSIS)
1000.0000 mL | Freq: Once | INTRAVENOUS | Status: AC
  Administered 2015-11-22: 1000 mL via INTRAVENOUS

## 2015-11-22 MED ORDER — MIDAZOLAM HCL 5 MG/5ML IJ SOLN
INTRAMUSCULAR | Status: DC | PRN
  Administered 2015-11-22: 2 mg via INTRAVENOUS

## 2015-11-22 MED ORDER — LIDOCAINE HCL (CARDIAC) 20 MG/ML IV SOLN
INTRAVENOUS | Status: AC
  Filled 2015-11-22: qty 5

## 2015-11-22 MED ORDER — HYDROMORPHONE HCL 2 MG PO TABS
2.0000 mg | ORAL_TABLET | ORAL | Status: DC | PRN
  Administered 2015-11-23 – 2015-11-24 (×5): 4 mg via ORAL
  Filled 2015-11-22 (×5): qty 2

## 2015-11-22 MED ORDER — SUCCINYLCHOLINE CHLORIDE 20 MG/ML IJ SOLN
INTRAMUSCULAR | Status: AC
  Filled 2015-11-22: qty 1

## 2015-11-22 MED ORDER — METHOCARBAMOL 1000 MG/10ML IJ SOLN
500.0000 mg | INTRAMUSCULAR | Status: AC
Start: 2015-11-22 — End: 2015-11-22
  Administered 2015-11-22: 500 mg via INTRAVENOUS
  Filled 2015-11-22: qty 5

## 2015-11-22 MED ORDER — PROPOFOL 10 MG/ML IV BOLUS
0.5000 mg/kg | Freq: Once | INTRAVENOUS | Status: AC
  Administered 2015-11-22: 70 mg via INTRAVENOUS

## 2015-11-22 MED ORDER — OXYCODONE HCL 5 MG PO TABS: 5.0000 mg | ORAL_TABLET | Freq: Once | ORAL | Status: DC | PRN

## 2015-11-22 MED ORDER — CEFAZOLIN SODIUM-DEXTROSE 2-3 GM-% IV SOLR
2.0000 g | Freq: Three times a day (TID) | INTRAVENOUS | Status: AC
  Administered 2015-11-23 – 2015-11-24 (×6): 2 g via INTRAVENOUS
  Filled 2015-11-22 (×6): qty 50

## 2015-11-22 MED ORDER — CHLORHEXIDINE GLUCONATE 4 % EX LIQD: 60.0000 mL | Freq: Once | CUTANEOUS | Status: DC

## 2015-11-22 MED ORDER — 0.9 % SODIUM CHLORIDE (POUR BTL) OPTIME
TOPICAL | Status: DC | PRN
  Administered 2015-11-22: 1000 mL

## 2015-11-22 MED ORDER — PROPOFOL 10 MG/ML IV BOLUS
INTRAVENOUS | Status: DC | PRN
  Administered 2015-11-22: 100 mg via INTRAVENOUS
  Administered 2015-11-22: 200 mg via INTRAVENOUS

## 2015-11-22 MED ORDER — CEFAZOLIN SODIUM-DEXTROSE 2-3 GM-% IV SOLR
INTRAVENOUS | Status: DC | PRN
  Administered 2015-11-22: 2 g via INTRAVENOUS

## 2015-11-22 SURGICAL SUPPLY — 77 items
BANDAGE ELASTIC 4 VELCRO ST LF (GAUZE/BANDAGES/DRESSINGS) ×3 IMPLANT
BANDAGE ELASTIC 6 VELCRO ST LF (GAUZE/BANDAGES/DRESSINGS) ×3 IMPLANT
BANDAGE ESMARK 6X9 LF (GAUZE/BANDAGES/DRESSINGS) ×2 IMPLANT
BAR 11X100 BAR ×6 IMPLANT
BAR GLASS FIBER EXFX 11X100 (Bar) ×6 IMPLANT
BAR GLASS FIBER EXFX 11X400 (EXFIX) ×6 IMPLANT
BLADE SURG 10 STRL SS (BLADE) ×3 IMPLANT
BLADE SURG 15 STRL LF DISP TIS (BLADE) ×2 IMPLANT
BLADE SURG 15 STRL SS (BLADE) ×1
BLADE SURG ROTATE 9660 (MISCELLANEOUS) IMPLANT
BNDG COHESIVE 4X5 TAN STRL (GAUZE/BANDAGES/DRESSINGS) ×3 IMPLANT
BNDG ESMARK 6X9 LF (GAUZE/BANDAGES/DRESSINGS) ×3
BNDG GAUZE ELAST 4 BULKY (GAUZE/BANDAGES/DRESSINGS) ×3 IMPLANT
BOVIE (MISCELLANEOUS) ×3 IMPLANT
CLAMP BLUE BAR TO BAR (MISCELLANEOUS) ×6 IMPLANT
CLAMP BLUE BAR TO PIN (MISCELLANEOUS) ×12 IMPLANT
COVER MAYO STAND STRL (DRAPES) ×3 IMPLANT
COVER SURGICAL LIGHT HANDLE (MISCELLANEOUS) ×3 IMPLANT
CUFF TOURNIQUET SINGLE 34IN LL (TOURNIQUET CUFF) ×3 IMPLANT
CUFF TOURNIQUET SINGLE 44IN (TOURNIQUET CUFF) IMPLANT
DRAPE C-ARM 42X72 X-RAY (DRAPES) ×3 IMPLANT
DRAPE C-ARMOR (DRAPES) ×3 IMPLANT
DRAPE INCISE IOBAN 66X45 STRL (DRAPES) ×3 IMPLANT
DRAPE OEC MINIVIEW 54X84 (DRAPES) IMPLANT
DRAPE ORTHO SPLIT 77X108 STRL (DRAPES) ×2
DRAPE PROXIMA HALF (DRAPES) ×6 IMPLANT
DRAPE SURG ORHT 6 SPLT 77X108 (DRAPES) ×4 IMPLANT
DRAPE U-SHAPE 47X51 STRL (DRAPES) ×3 IMPLANT
DRSG ADAPTIC 3X8 NADH LF (GAUZE/BANDAGES/DRESSINGS) ×3 IMPLANT
DRSG PAD ABDOMINAL 8X10 ST (GAUZE/BANDAGES/DRESSINGS) ×3 IMPLANT
DURAPREP 26ML APPLICATOR (WOUND CARE) ×3 IMPLANT
ELECT REM PT RETURN 9FT ADLT (ELECTROSURGICAL) ×3
ELECTRODE REM PT RTRN 9FT ADLT (ELECTROSURGICAL) ×2 IMPLANT
GAUZE SPONGE 4X4 12PLY STRL (GAUZE/BANDAGES/DRESSINGS) ×3 IMPLANT
GAUZE XEROFORM 1X8 LF (GAUZE/BANDAGES/DRESSINGS) IMPLANT
GAUZE XEROFORM 5X9 LF (GAUZE/BANDAGES/DRESSINGS) ×3 IMPLANT
GLOVE BIOGEL PI IND STRL 8 (GLOVE) ×4 IMPLANT
GLOVE BIOGEL PI INDICATOR 8 (GLOVE) ×2
GLOVE ECLIPSE 7.5 STRL STRAW (GLOVE) ×6 IMPLANT
GOWN BRE IMP PREV XXLGXLNG (GOWN DISPOSABLE) ×3 IMPLANT
GOWN L4 XXLG W/PAP TWL (GOWN DISPOSABLE) ×3 IMPLANT
GOWN STRL REUS W/ TWL LRG LVL3 (GOWN DISPOSABLE) ×2 IMPLANT
GOWN STRL REUS W/ TWL XL LVL3 (GOWN DISPOSABLE) ×4 IMPLANT
GOWN STRL REUS W/TWL LRG LVL3 (GOWN DISPOSABLE) ×1
GOWN STRL REUS W/TWL XL LVL3 (GOWN DISPOSABLE) ×2
HALF PIN 3MM (PIN) ×3 IMPLANT
K-WIRE DBL TROCAR .062X4 ×3 IMPLANT
KIT BASIN OR (CUSTOM PROCEDURE TRAY) ×3 IMPLANT
KIT ROOM TURNOVER OR (KITS) ×3 IMPLANT
KWIRE DBL TROCAR .062X4 ×2 IMPLANT
MANIFOLD NEPTUNE II (INSTRUMENTS) ×3 IMPLANT
NS IRRIG 1000ML POUR BTL (IV SOLUTION) ×3 IMPLANT
PACK ORTHO EXTREMITY (CUSTOM PROCEDURE TRAY) ×3 IMPLANT
PACK SURGICAL SETUP 50X90 (CUSTOM PROCEDURE TRAY) ×3 IMPLANT
PAD ARMBOARD 7.5X6 YLW CONV (MISCELLANEOUS) ×6 IMPLANT
PAD CAST 4YDX4 CTTN HI CHSV (CAST SUPPLIES) ×2 IMPLANT
PADDING CAST COTTON 4X4 STRL (CAST SUPPLIES) ×1
PIN 3MM (PIN) ×3 IMPLANT
PIN CLAMP 2BAR 75MM BLUE (PIN) ×3 IMPLANT
PIN HALF YELLOW 5X160X35 (PIN) ×3 IMPLANT
PIN TRANSFIXING 5.0 (PIN) ×3 IMPLANT
SLEEVE SURGEON STRL (DRAPES) ×3 IMPLANT
SPONGE LAP 18X18 X RAY DECT (DISPOSABLE) ×3 IMPLANT
SPONGE LAP 4X18 X RAY DECT (DISPOSABLE) ×6 IMPLANT
STAPLER VISISTAT 35W (STAPLE) IMPLANT
STOCKINETTE IMPERVIOUS 9X36 MD (GAUZE/BANDAGES/DRESSINGS) ×3 IMPLANT
SUCTION FRAZIER TIP 10 FR DISP (SUCTIONS) ×3 IMPLANT
SUT ETHILON 2 0 FS 18 (SUTURE) ×3 IMPLANT
SUT ETHILON 4 0 PS 2 18 (SUTURE) IMPLANT
SUT VIC AB 0 CTB1 27 (SUTURE) IMPLANT
SUT VIC AB 2-0 FS1 27 (SUTURE) IMPLANT
SYR CONTROL 10ML LL (SYRINGE) IMPLANT
TOWEL OR 17X24 6PK STRL BLUE (TOWEL DISPOSABLE) ×3 IMPLANT
TOWEL OR 17X26 10 PK STRL BLUE (TOWEL DISPOSABLE) ×3 IMPLANT
TUBE CONNECTING 12X1/4 (SUCTIONS) ×3 IMPLANT
WATER STERILE IRR 1000ML POUR (IV SOLUTION) ×3 IMPLANT
YANKAUER SUCT BULB TIP NO VENT (SUCTIONS) IMPLANT

## 2015-11-22 NOTE — ED Notes (Signed)
Ortho Tech enroute to ED for sedation and realignment of LLE. Wife at bedside and signed sedation and procedure consent

## 2015-11-22 NOTE — Sedation Documentation (Signed)
Present during the sedation, reduction and setting of the left leg was Dr. Ashok Cordia, Dr. Ivor Reining, Wellington Hampshire., RN head nurse, Phillis Haggis., RN, Larey Seat, EMT, Harriet Masson, Ortho Tech, a

## 2015-11-22 NOTE — ED Provider Notes (Addendum)
ORTHOPEDIC INJURY TREATMENT Date/Time: 11/22/2015 5:14 PM Performed by: Gustavus Bryant Authorized by: Gustavus Bryant Consent: Written consent obtained. Consent given by: patient Time out: Immediately prior to procedure a "time out" was called to verify the correct patient, procedure, equipment, support staff and site/side marked as required. Injury location: lower leg Pre-procedure neurovascular assessment: neurovascularly intact Pre-procedure distal perfusion: normal Pre-procedure neurological function: normal Pre-procedure range of motion: reduced Patient sedated: yes Sedation type: moderate (conscious) sedation Sedatives: propofol Immobilization: splint (distal traction applied to LLE with manipulation of open distal tibia into anatomic alignment) Supplies used: Ortho-Glass Post-procedure neurovascular assessment: post-procedure neurovascularly intact Post-procedure distal perfusion: normal Post-procedure neurological function: normal Post-procedure range of motion: improved Patient tolerance: Patient tolerated the procedure well with no immediate complications   Supervised by Dr. Ashok Cordia.  Sedation time: 15 minutes.  Pt returned to normal alertness.    Gustavus Bryant, MD 11/22/15 1716   I saw and evaluated the patient, reviewed the resident's note and I agree with the findings and plan. See H and P by me.  Lajean Saver, MD 11/22/15 [place], MD 11/22/15 1801  Lajean Saver, MD 12/12/15 [phone]

## 2015-11-22 NOTE — H&P (Signed)
PREOPERATIVE H&P  Chief Complaint: Open distal tib-fib fractureLeft.  HPI: Randy Bennett is a 59 y.o. male who presents for evaluation of Left distal ankle fracture. It has been present for Several hours and has been worsening. He has failed conservative measures. Pain is rated as severe.The patient has not had previous history of problems with the ankle.  He fell off a ladder and suffered a severe injury and went to emergency room where he was evaluated and we were consulted.  Past Medical History  Diagnosis Date  . Viral meningitis 2003  . Bacterial meningitis 2006  . [place] spotted fever 2006  . CSF leak 2006  . Hypertension   . Cancer ([place])     basil cell skin 2000-2001  . Headache   . Shingles   . Meningitis   . Dysphagia    Past Surgical History  Procedure Laterality Date  . Appendectomy  1970  . Laminectomy  [id number]  . Knee arthroscopy with anterior cruciate ligament (acl) repair Bilateral 1993 (L), 1992 (R)  . Skin cancer removal  2000-2001  . Colonoscopy    . Esophagogastroduodenoscopy    . Colonoscopy with propofol N/A 07/01/2015    Procedure: COLONOSCOPY WITH PROPOFOL;  Surgeon: Manya Silvas, MD;  Location: [facility] ENDOSCOPY;  Service: Endoscopy;  Laterality: N/A;  . Esophagogastroduodenoscopy N/A 07/01/2015    Procedure: ESOPHAGOGASTRODUODENOSCOPY (EGD);  Surgeon: Manya Silvas, MD;  Location: [facility] ENDOSCOPY;  Service: Endoscopy;  Laterality: N/A;  . Savory dilation N/A 07/01/2015    Procedure: SAVORY DILATION;  Surgeon: Manya Silvas, MD;  Location: [facility] ENDOSCOPY;  Service: Endoscopy;  Laterality: N/A;   Social History   Social History  . Marital Status: Married    Spouse Name: N/A  . Number of Children: N/A  . Years of Education: N/A   Social History Main Topics  . Smoking status: Never Smoker   . Smokeless tobacco: Never Used  . Alcohol Use: No  . Drug Use: No  . Sexual Activity: Not Asked   Other Topics Concern  . None   Social  History Narrative   Family History  Problem Relation Age of Onset  . Cancer Mother    Allergies  Allergen Reactions  . Lodine [Etodolac] Rash  . Prednisolone Rash   Prior to Admission medications   Medication Sig Start Date End Date Taking? Authorizing Provider  atorvastatin (LIPITOR) 20 MG tablet Take 20 mg by mouth daily at 6 PM.    Historical Provider, MD  cetirizine (ZYRTEC) 10 MG tablet Take 10 mg by mouth daily.    Historical Provider, MD  diazepam (VALIUM) 5 MG tablet Take 5 mg by mouth every 6 (six) hours as needed for anxiety.    Historical Provider, MD  HYDROmorphone (DILAUDID) 2 MG tablet Take 2 mg by mouth every 4 (four) hours as needed for severe pain.    Historical Provider, MD  ibuprofen (ADVIL,MOTRIN) 800 MG tablet Take 800 mg by mouth every 8 (eight) hours as needed.    Historical Provider, MD  Multiple Vitamin (MULTIVITAMIN) tablet Take 1 tablet by mouth daily.    Historical Provider, MD  ondansetron (ZOFRAN) 4 MG tablet Take 4 mg by mouth every 8 (eight) hours as needed for nausea or vomiting.    Historical Provider, MD     Positive ROS: none  All other systems have been reviewed and were otherwise negative with the exception of those mentioned in the HPI and as above.  Physical Exam: Filed Vitals:  11/22/15 1725 11/22/15 1730  BP: 145/93 101/57  Pulse: 89 81  Temp:    Resp: 18 18    General: Alert, no acute distress Cardiovascular: No pedal edema Respiratory: No cyanosis, no use of accessory musculature GI: No organomegaly, abdomen is soft and non-tender Skin: No lesions in the area of chief complaint Neurologic: Sensation intact distally Psychiatric: Patient is competent for consent with normal mood and affect Lymphatic: No axillary or cervical lymphadenopathy  MUSCULOSKELETAL: Left ankle: Open medial wound with mild contamination.  Obvious angular deformity. X-ray: An obvious comminuted distal tibial intra-articular  fracture Assessment/Plan: Pilon FractureGrade 2 open left Plan for Procedure(s): Irrigation and debridement of left open fracture withOPEN REDUCTION INTERNAL FIXATION (ORIF) ANKLE FRACTURE VersusEXTERNAL FIXATIONLeft LEG  The risks benefits and alternatives were discussed with the patient including but not limited to the risks of nonoperative treatment, versus surgical intervention including infection, bleeding, nerve injury, malunion, nonunion, hardware prominence, hardware failure, need for hardware removal, blood clots, cardiopulmonary complications, morbidity, mortality, among others, and they were willing to proceed.  Predicted outcome is good, although there will be at least a six to nine month expected recovery.  Alta Corning, MD 11/22/2015 5:57 PM

## 2015-11-22 NOTE — Brief Op Note (Signed)
11/22/2015  11:32 PM  PATIENT:  Randy Bennett  59 y.o. male  PRE-OPERATIVE DIAGNOSIS:  Pilon Fracture  POST-OPERATIVE DIAGNOSIS:  Pilon Fracture  PROCEDURE:  Procedure(s): EXTERNAL FIXATION LEFT  LEG AND IRRIGATION AND DEBRIDEMENT AND PINNING (Left)  SURGEON:  Surgeon(s) and Role:    * Dorna Leitz, MD - Primary  PHYSICIAN ASSISTANT:   ASSISTANTS: bethune   ANESTHESIA:   none  EBL:  Total I/O In: [id number] [P.O.:240; I.V.:1100] Out: 600 [Urine:600]  BLOOD ADMINISTERED:none  DRAINS: none   LOCAL MEDICATIONS USED:  NONE  SPECIMEN:  No Specimen  DISPOSITION OF SPECIMEN:  N/A  COUNTS:  YES  TOURNIQUET:   Total Tourniquet Time Documented: Thigh (Left) - 57 minutes Total: Thigh (Left) - 57 minutes   DICTATION: .Other Dictation: Dictation Number [id number]  PLAN OF CARE: Admit to inpatient   PATIENT DISPOSITION:  PACU - hemodynamically stable.   Delay start of Pharmacological VTE agent (>24hrs) due to surgical blood loss or risk of bleeding: no

## 2015-11-22 NOTE — ED Notes (Signed)
To CT and Radiology for plain films. Will get and ECG when pt gets back then to OR holding

## 2015-11-22 NOTE — ED Notes (Addendum)
To ED for eval after falling approx 10 ft from ladder. With left open lower leg fx. Pt alert and in obvious pain. 200mg  Fentanyl enroute. 100mg  Fentanyl given on arrival prior to moving pt to ED stretcher

## 2015-11-22 NOTE — ED Provider Notes (Addendum)
CSN: [id number]     Arrival date & time 11/22/15  1532 History   First MD Initiated Contact with Patient 11/22/15 1539     Chief Complaint  Patient presents with  . Fall     (Consider location/radiation/quality/duration/timing/severity/associated sxs/prior Treatment) Patient is a 59 y.o. male presenting with fall. The history is provided by the patient.  Fall Pertinent negatives include no chest pain, no abdominal pain, no headaches and no shortness of breath.  Patient s/p fall at home, was approximately 10 feet up on ladder, cleaning gutters, when slipped and fell. No loc.  Open fracture to left lower leg.  Denies numbness/weakness. No headache. No neck or back pain. No chest pain or sob. No abd pain. No nv. Pain severe, constant, worse w any movement. No radiation. Prior to fall was in baseline, well, state of health, was asymptomatic. No faintness or dizziness.  Last tetanus shot approx 2 yrs ago, per pt.     Past Medical History  Diagnosis Date  . Viral meningitis 2003  . Bacterial meningitis 2006  . [place] spotted fever 2006  . CSF leak 2006  . Hypertension   . Cancer ([place])     basil cell skin 2000-2001  . Headache   . Shingles   . Meningitis   . Dysphagia    Past Surgical History  Procedure Laterality Date  . Appendectomy  1970  . Laminectomy  [id number]  . Knee arthroscopy with anterior cruciate ligament (acl) repair Bilateral 1993 (L), 1992 (R)  . Skin cancer removal  2000-2001  . Colonoscopy    . Esophagogastroduodenoscopy    . Colonoscopy with propofol N/A 07/01/2015    Procedure: COLONOSCOPY WITH PROPOFOL;  Surgeon: Manya Silvas, MD;  Location: [facility] ENDOSCOPY;  Service: Endoscopy;  Laterality: N/A;  . Esophagogastroduodenoscopy N/A 07/01/2015    Procedure: ESOPHAGOGASTRODUODENOSCOPY (EGD);  Surgeon: Manya Silvas, MD;  Location: [facility] ENDOSCOPY;  Service: Endoscopy;  Laterality: N/A;  . Savory dilation N/A 07/01/2015    Procedure: SAVORY DILATION;   Surgeon: Manya Silvas, MD;  Location: [facility] ENDOSCOPY;  Service: Endoscopy;  Laterality: N/A;   Family History  Problem Relation Age of Onset  . Cancer Mother    Social History  Substance Use Topics  . Smoking status: Never Smoker   . Smokeless tobacco: Never Used  . Alcohol Use: No    Review of Systems  Constitutional: Negative for fever.  HENT: Negative for nosebleeds and sore throat.   Eyes: Negative for pain and visual disturbance.  Respiratory: Negative for shortness of breath.   Cardiovascular: Negative for chest pain.  Gastrointestinal: Negative for vomiting and abdominal pain.  Genitourinary: Negative for flank pain.  Musculoskeletal: Negative for back pain and neck pain.  Skin: Positive for wound. Negative for rash.  Neurological: Negative for weakness, numbness and headaches.  Hematological: Does not bruise/bleed easily.  Psychiatric/Behavioral: Negative for confusion.      Allergies  Lodine and Prednisolone  Home Medications   Prior to Admission medications   Medication Sig Start Date End Date Taking? Authorizing Provider  atorvastatin (LIPITOR) 20 MG tablet Take 20 mg by mouth daily at 6 PM.    Historical Provider, MD  cetirizine (ZYRTEC) 10 MG tablet Take 10 mg by mouth daily.    Historical Provider, MD  diazepam (VALIUM) 5 MG tablet Take 5 mg by mouth every 6 (six) hours as needed for anxiety.    Historical Provider, MD  HYDROmorphone (DILAUDID) 2 MG tablet Take 2 mg by  mouth every 4 (four) hours as needed for severe pain.    Historical Provider, MD  ibuprofen (ADVIL,MOTRIN) 800 MG tablet Take 800 mg by mouth every 8 (eight) hours as needed.    Historical Provider, MD  Multiple Vitamin (MULTIVITAMIN) tablet Take 1 tablet by mouth daily.    Historical Provider, MD  ondansetron (ZOFRAN) 4 MG tablet Take 4 mg by mouth every 8 (eight) hours as needed for nausea or vomiting.    Historical Provider, MD   There were no vitals taken for this visit. Physical Exam   Constitutional: He is oriented to person, place, and time. He appears well-developed and well-nourished.  Pt in pain  HENT:  Head: Atraumatic.  Nose: Nose normal.  Mouth/Throat: Oropharynx is clear and moist.  Eyes: Conjunctivae are normal. Pupils are equal, round, and reactive to light. No scleral icterus.  Neck: Normal range of motion. Neck supple. No tracheal deviation present.  No bruit. Trachea midline.  c collar  Cardiovascular: Normal rate, regular rhythm, normal heart sounds and intact distal pulses.  Exam reveals no gallop and no friction rub.   No murmur heard. Pulmonary/Chest: Effort normal and breath sounds normal. No accessory muscle usage. No respiratory distress. He exhibits no tenderness.  Abdominal: Soft. Bowel sounds are normal. He exhibits no distension and no mass. There is no tenderness. There is no rebound and no guarding.  No abdominal wall contusion or brusing.   Genitourinary:  No cva or flank tenderness  Musculoskeletal: Normal range of motion.  CTLS spine, non tender, aligned, no step off.  Open wound to medial aspect of left lower leg, a few cm above ankle, with bone protruding 1 cm through approximately 4 cm open open. Distal pulses palp.  No other focal pain or bony tenderness noted.    Neurological: He is alert and oriented to person, place, and time.  Motor intact bil, ext, able to wiggle toes of left foot, sens grossly intact.    Skin: Skin is warm. He is not diaphoretic.  Psychiatric:  Anxious, in pain.   Nursing note and vitals reviewed.   ED Course  .Sedation Date/Time: 11/22/2015 5:49 PM Performed by: Lajean Saver Authorized by: Lajean Saver  Consent:    Consent obtained:  Verbal, written and emergent situation   Consent given by:  Patient and spouse   Risks discussed:  Prolonged hypoxia resulting in organ damage, prolonged sedation necessitating reversal, respiratory compromise necessitating ventilatory assistance and intubation, vomiting,  nausea and inadequate sedation   Alternatives discussed:  Analgesia without sedation and anxiolysis Indications:    Sedation purpose:  Fracture reduction   Procedure necessitating sedation performed by:  Different physician   Intended level of sedation:  Deep Pre-sedation assessment:    Time since last food or drink:  4 hrs   NPO status caution: urgency dictates proceeding with non-ideal NPO status     ASA classification: class 1 - normal, healthy patient     Normal range of motion: ccollar.     Pre-sedation assessments completed and reviewed: airway patency, cardiovascular function, hydration status, mental status, nausea/vomiting, pain level, respiratory function and temperature   Immediate pre-procedure details:    Reassessment: Patient reassessed immediately prior to procedure     Reviewed: vital signs, relevant labs/tests and NPO status     Verified: bag valve mask available, emergency equipment available, intubation equipment available, IV patency confirmed, oxygen available and suction available   Procedure details (see MAR for exact dosages):    Preoxygenation:  Nasal  cannula   Sedation:  Propofol   Analgesia:  Hydromorphone   Intra-procedure monitoring:  Blood pressure monitoring, cardiac monitor, continuous pulse oximetry, frequent LOC assessments and frequent vital sign checks   Intra-procedure events: none     Intra-procedure management:  Supplemental oxygen Post-procedure details:    Post-sedation assessment completed:  11/22/2015 5:51 PM   Attendance: Constant attendance by certified staff until patient recovered     Recovery: Patient returned to pre-procedure baseline     Post-sedation assessments completed and reviewed: airway patency, cardiovascular function, mental status, pain level and respiratory function     Patient is stable for discharge or admission: Yes     Patient tolerance:  Tolerated well, no immediate complications  (including critical care time) Labs  Review  Results for orders placed or performed during the hospital encounter of 11/22/15  CBC  Result Value Ref Range   WBC 14.4 (H) 4.0 - 10.5 K/uL   RBC 4.87 4.22 - 5.81 MIL/uL   Hemoglobin 14.9 13.0 - 17.0 g/dL   HCT 44.0 39.0 - 52.0 %   MCV 90.3 78.0 - 100.0 fL   MCH 30.6 26.0 - 34.0 pg   MCHC 33.9 30.0 - 36.0 g/dL   RDW 12.8 11.5 - 15.5 %   Platelets 285 150 - 400 K/uL  Basic metabolic panel  Result Value Ref Range   Sodium 136 135 - 145 mmol/L   Potassium 3.4 (L) 3.5 - 5.1 mmol/L   Chloride 104 101 - 111 mmol/L   CO2 21 (L) 22 - 32 mmol/L   Glucose, Bld 149 (H) 65 - 99 mg/dL   BUN 17 6 - 20 mg/dL   Creatinine, Ser 1.55 (H) 0.61 - 1.24 mg/dL   Calcium 9.3 8.9 - 10.3 mg/dL   GFR calc non Af Amer 48 (L) >60 mL/min   GFR calc Af Amer 55 (L) >60 mL/min   Anion gap 11 5 - 15  Protime-INR  Result Value Ref Range   Prothrombin Time 14.6 11.6 - 15.2 seconds   INR 1.12 0.00 - 1.49  Type and screen  Result Value Ref Range   ABO/RH(D) O POS    Antibody Screen NEG    Sample Expiration 11/25/2015   ABO/Rh  Result Value Ref Range   ABO/RH(D) O POS    Dg Thoracic Spine 2 View  11/22/2015  CLINICAL DATA:  Status post fall.  Generalized back pain. EXAM: THORACIC SPINE 2 VIEWS COMPARISON:  None. FINDINGS: There is no evidence of thoracic spine fracture. Alignment is normal. No other significant bone abnormalities are identified. IMPRESSION: Negative. Electronically Signed   By: Kathreen Devoid   On: 11/22/2015 18:24   Dg Lumbar Spine 2-3 Views  11/22/2015  CLINICAL DATA:  Status post fall from 10 feet.  Back pain. EXAM: LUMBAR SPINE - 2-3 VIEW COMPARISON:  CT abdomen 07/06/2014 FINDINGS: The vertebral body heights are maintained. Alignment is normal. Intervertebral disc spaces are maintained. IMPRESSION: No acute osseous injury of the lumbar spine. Electronically Signed   By: Kathreen Devoid   On: 11/22/2015 18:27   Dg Tibia/fibula Left  11/22/2015  CLINICAL DATA:  Status post fall  from 06/08 foot height while on a ladder, open fracture deformity of the distal left leg. EXAM: LEFT TIBIA AND FIBULA - 2 VIEW COMPARISON:  None in PACs FINDINGS: The patient has sustained an acute comminuted fracture of the distal left tibial metaphysis. A fracture line extends through the articular surface of the distal tibia. There is  apex medial angulation at the fracture site. There is a comminuted angulated and displaced fracture of the distal third of the shaft of the left fibula with overriding of fracture fragments. There is disruption of the lateral aspect of the ankle joint mortise. More proximally the shafts of the tibia and fibula are intact. The limited visualization of the knee reveals mild degenerative change but no acute fracture. An open wound over the medial malleolar region is present. IMPRESSION: Opened, comminuted angulated and displaced fractures of the distal tibia and fibula as described. The distal tibial fracture line extends to the articular surface with the talus. Electronically Signed   By: David  Martinique M.D.   On: 11/22/2015 16:32   Ct Cervical Spine Wo Contrast  11/22/2015  CLINICAL DATA:  59 year old male status post fall from a ladder. Patient denies neck pain. EXAM: CT CERVICAL SPINE WITHOUT CONTRAST TECHNIQUE: Multidetector CT imaging of the cervical spine was performed without intravenous contrast. Multiplanar CT image reconstructions were also generated. COMPARISON:  None. FINDINGS: There is no acute fracture or subluxation of the cervical spine.There mild degenerative changes most prominent at C6-C7 where there is endplate irregularity and disc space narrowing.The odontoid and spinous processes are intact.There is normal anatomic alignment of the C1-C2 lateral masses. The visualized soft tissues appear unremarkable. There is mild mucoperiosteal thickening of the visualized paranasal sinuses. The included mastoid air cells are clear. IMPRESSION: No acute fracture or  subluxation. Electronically Signed   By: Anner Crete M.D.   On: 11/22/2015 18:22   Dg Pelvis Portable  11/22/2015  CLINICAL DATA:  Status post 68 foot fall from a ladder while cleaning gutter. Patient has open fracture of the left tibia and fibula. EXAM: PORTABLE PELVIS 1-2 VIEWS COMPARISON:  None. FINDINGS: Exam is limited evaluation of the proximal left femur due to rotation of the femur. No gross displacement is identified. IMPRESSION: Exam is limited evaluation of the proximal left femur due to rotation of the femur. No gross displacement is identified. Electronically Signed   By: Abelardo Diesel M.D.   On: 11/22/2015 16:28   Dg Chest Port 1 View  11/22/2015  CLINICAL DATA:  59 year old who fell approximately 6-8 feet from a ladder earlier today while cleaning gutters, sustaining an open compound fracture of the left tibia-fibula. Preoperative respiratory evaluation. EXAM: PORTABLE CHEST 1 VIEW COMPARISON:  04/29/2007. FINDINGS: Cardiac silhouette likely upper normal in size for AP portable technique. Hilar and mediastinal contours otherwise unremarkable. Lungs clear. Bronchovascular markings normal. Pulmonary vascularity normal. No visible pleural effusions. No pneumothorax. IMPRESSION: No acute cardiopulmonary disease. Electronically Signed   By: Evangeline Dakin M.D.   On: 11/22/2015 16:29      I have personally reviewed and evaluated these images and lab results as part of my medical decision-making.    MDM  c collar and spine precautions.    Iv ns bolus. Monitor. Continuous pulse ox.   Sterile, moist dressing to wound.   Ortho tech-  Splint applied.   Confirmed tet up to date w pt.   Dilaudid iv. zofran iv. Ancef iv.   Xrays.   Reviewed nursing notes and prior charts for additional history.   1635, discussed open fx left distal tibia, complex ankle fx w Dr Berenice Primas, on call for ortho - he reviewed films, and requests we pull on ankle/foot, for improved alignment, he will  see in ED and take to OR.  Pt refuses any attempt at move/reduce ankle, even post dilaudid/pain meds.  Discussed procedural sedation  with patient, and reasons for attempted reduction/better positioning in ED.  Pt gave written and verbal consent.   Propofol iv - total 7 ml's used.  No complications. Pt o2 sats good. No vomiting.   Recheck post procedure and splint.  Intact distal pulse, and pain improved.   No abd pain. abd soft nt. Chest cta bil. Pulse ox 98%.  Orthopedist indicates ready in OR.   Pt in ct c spine currently (as could not clear due to distracting injury/significant pain).  Pt remains in c collar and spine precautions.  pts T and LS spine xrays also pending - will need to be reviewed, pt re-examined before spine clear.   Spine films neg acute.  Pt had no focal or point midline spine tenderness. Spine clear.         Lajean Saver, MD 11/22/15 [phone]

## 2015-11-22 NOTE — Anesthesia Preprocedure Evaluation (Signed)
Anesthesia Evaluation  Patient identified by MRN, date of birth, ID band Patient awake    Reviewed: Allergy & Precautions, NPO status , Patient's Chart, lab work & pertinent test results  Airway Mallampati: II  TM Distance: >3 FB Neck ROM: Limited    Dental  (+) Teeth Intact, Dental Advisory Given   Pulmonary    breath sounds clear to auscultation       Cardiovascular hypertension,  Rhythm:Regular Rate:Normal     Neuro/Psych    GI/Hepatic   Endo/Other    Renal/GU      Musculoskeletal   Abdominal   Peds  Hematology   Anesthesia Other Findings Patient in C-Collar, no C-spine tenderness to axial compression  Reproductive/Obstetrics                             Anesthesia Physical Anesthesia Plan  ASA: II  Anesthesia Plan: General   Post-op Pain Management:    Induction: Intravenous  Airway Management Planned: Oral ETT  Additional Equipment:   Intra-op Plan:   Post-operative Plan: Extubation in OR  Informed Consent: I have reviewed the patients History and Physical, chart, labs and discussed the procedure including the risks, benefits and alternatives for the proposed anesthesia with the patient or authorized representative who has indicated his/her understanding and acceptance.   Dental advisory given  Plan Discussed with: CRNA and Anesthesiologist  Anesthesia Plan Comments: (L. Open ankle fracture Patient in C-collar, neck uncleared, C-spine CT (-) neck is clinically negative, will keep c-spine neutral until c-spine formally cleared.  )        Anesthesia Quick Evaluation

## 2015-11-22 NOTE — Anesthesia Procedure Notes (Signed)
Procedure Name: Intubation Date/Time: 11/22/2015 7:02 PM Performed by: Claris Che Pre-anesthesia Checklist: Patient identified, Emergency Drugs available, Suction available, Patient being monitored and Timeout performed Patient Re-evaluated:Patient Re-evaluated prior to inductionOxygen Delivery Method: Circle system utilized Preoxygenation: Pre-oxygenation with 100% oxygen Intubation Type: Rapid sequence and Cricoid Pressure applied Ventilation: Mask ventilation without difficulty Laryngoscope Size: Mac and 3 Grade View: Grade I Tube type: Oral Tube size: 7.5 mm Number of attempts: 1 Airway Equipment and Method: Stylet and Oral airway Placement Confirmation: ETT inserted through vocal cords under direct vision,  positive ETCO2 and breath sounds checked- equal and bilateral Secured at: 23 cm Tube secured with: Tape Dental Injury: Teeth and Oropharynx as per pre-operative assessment

## 2015-11-22 NOTE — ED Notes (Signed)
Ortho tech at bedside preparing to splint. Xray at bedside

## 2015-11-22 NOTE — Progress Notes (Signed)
Orthopedic Tech Progress Note Patient Details:  Randy Bennett 17-Oct-1956 [id number]  Ortho Devices Type of Ortho Device: Ace wrap, Stirrup splint, Post (short leg) splint Ortho Device/Splint Location: LLE Ortho Device/Splint Interventions: Ordered, Application   [name] 11/22/2015, 5:22 PM

## 2015-11-22 NOTE — ED Notes (Signed)
Present during sedation, reduction, and setting of Mr. Burkland left leg were Dr. Ashok Cordia, Dr. Ivor Reining, Wellington Hampshire., RN primary nurse, Phillis Haggis., RN, Alfonso Ramus, Lorin Mercy., Ortho Tech, and RT.

## 2015-11-22 NOTE — Transfer of Care (Signed)
Immediate Anesthesia Transfer of Care Note  Patient: Randy Bennett  Procedure(s) Performed: Procedure(s): EXTERNAL FIXATION LEFT  LEG AND IRRIGATION AND DEBRIDEMENT AND PINNING (Left)  Patient Location: PACU  Anesthesia Type:General  Level of Consciousness: awake, alert , oriented, patient cooperative and responds to stimulation  Airway & Oxygen Therapy: Patient Spontanous Breathing and Patient connected to nasal cannula oxygen  Post-op Assessment: Report given to RN, Post -op Vital signs reviewed and stable and Patient moving all extremities X 4  Post vital signs: Reviewed and stable  Last Vitals:  Filed Vitals:   11/22/15 1740 11/22/15 2051  BP:  148/77  Pulse: 72 66  Temp:  36.9 C  Resp: 15 15    Complications: No apparent anesthesia complications

## 2015-11-23 ENCOUNTER — Inpatient Hospital Stay (HOSPITAL_COMMUNITY): Payer: BLUE CROSS/BLUE SHIELD

## 2015-11-23 ENCOUNTER — Encounter (HOSPITAL_COMMUNITY): Payer: Self-pay | Admitting: General Practice

## 2015-11-23 ENCOUNTER — Encounter (HOSPITAL_COMMUNITY)
Admission: EM | Disposition: A | Discharge status: Home / Self Care | Payer: Self-pay | Source: Home / Self Care | Attending: Orthopedic Surgery

## 2015-11-23 SURGERY — OPEN REDUCTION INTERNAL FIXATION (ORIF) ANKLE FRACTURE
Anesthesia: Choice | Laterality: Left

## 2015-11-23 MED ORDER — INFLUENZA VAC SPLIT QUAD 0.5 ML IM SUSY
0.5000 mL | PREFILLED_SYRINGE | INTRAMUSCULAR | Status: AC
  Administered 2015-11-24: 0.5 mL via INTRAMUSCULAR
  Filled 2015-11-23: qty 0.5

## 2015-11-23 NOTE — Care Management Note (Signed)
Case Management Note  Patient Details  Name: Randy Bennett MRN: [id number] Date of Birth: Nov 25, 1956  Subjective/Objective:                    Action/Plan:  Initial UR completed  Expected Discharge Date:                  Expected Discharge Plan:     In-House Referral:     Discharge planning Services     Post Acute Care Choice:    Choice offered to:     DME Arranged:    DME Agency:     HH Arranged:    [place] Agency:     Status of Service:  In process, will continue to follow  Medicare Important Message Given:    Date Medicare IM Given:    Medicare IM give by:    Date Additional Medicare IM Given:    Additional Medicare Important Message give by:     If discussed at [place] of Stay Meetings, dates discussed:    Additional Comments:  Marilu Favre, RN 11/23/2015, 8:40 AM

## 2015-11-23 NOTE — Anesthesia Postprocedure Evaluation (Signed)
Anesthesia Post Note  Patient: Randy Bennett  Procedure(s) Performed: Procedure(s) (LRB): EXTERNAL FIXATION LEFT  LEG AND IRRIGATION AND DEBRIDEMENT AND PINNING (Left)  Patient location during evaluation: PACU Anesthesia Type: General Level of consciousness: awake and alert Pain management: pain level controlled Vital Signs Assessment: post-procedure vital signs reviewed and stable Respiratory status: spontaneous breathing, nonlabored ventilation, respiratory function stable and patient connected to nasal cannula oxygen Cardiovascular status: blood pressure returned to baseline and stable Postop Assessment: no signs of nausea or vomiting Anesthetic complications: no    Last Vitals:  Filed Vitals:   11/22/15 2300 11/22/15 2354  BP: 120/63 119/68  Pulse: 76 67  Temp: 36.7 C 36.7 C  Resp: 16 16    Last Pain:  Filed Vitals:   11/22/15 2356  PainSc: 10-Worst pain ever                 Effie Berkshire

## 2015-11-23 NOTE — Progress Notes (Signed)
Subjective: 1 Day Post-Op Procedure(s) (LRB): EXTERNAL FIXATION LEFT  LEG AND IRRIGATION AND DEBRIDEMENT AND PINNING (Left) Patient reports pain as moderate.  Patient at bedrest. Taking by mouth. Foley catheter still intact. Ready to get it removed. Wife and family at bedside.  Objective: Vital signs in last 24 hours: Temp:  [97.4 F (36.3 C)-98.5 F (36.9 C)] 97.9 F (36.6 C) (11/30 1015) Pulse Rate:  [64-89] 72 (11/30 1015) Resp:  [10-18] 16 (11/30 1015) BP: (101-148)/(57-93) 111/80 mmHg (11/30 1015) SpO2:  [91 %-100 %] 100 % (11/30 1015) Weight:  [81.647 kg (180 lb)-91.581 kg (201 lb 14.4 oz)] 91.581 kg (201 lb 14.4 oz) (11/29 2212)  Intake/Output from previous day: 11/29 0701 - 11/30 0700 In: 1870 [P.O.:720; I.V.:1150] Out: 5000 [Urine:5000] Intake/Output this shift: Total I/O In: 480 [P.O.:480] Out: 1600 [Urine:1600]   Recent Labs  11/22/15 1500  HGB 14.9    Recent Labs  11/22/15 1500  WBC 14.4*  RBC 4.87  HCT 44.0  PLT 285    Recent Labs  11/22/15 1500  NA 136  K 3.4*  CL 104  CO2 21*  BUN 17  CREATININE 1.55*  GLUCOSE 149*  CALCIUM 9.3    Recent Labs  11/22/15 1500  INR 1.12   Left lower extremity exam: External fixator intact. Good sensation in toes. Moves toes actively.. Dressings are clean and dry. DP pulse 2+.   Assessment/Plan: 1 Day Post-Op Procedure(s) (LRB): EXTERNAL FIXATION LEFT  LEG AND IRRIGATION AND DEBRIDEMENT AND PINNING (Left) Comminuted open Pilon fracture left lower extremity. Plan: Dr. Berenice Primas has discussed the case with Altamese [place] orthopedic trauma surgeon. CT scan of the left distal tibia/ankle has been performed. He will continue on IV antibiotics. Continue IV pain meds. Discontinue Foley. Okay to have the patient up with therapy nonweightbearing on the left lower extremity. Dr. Marcelino Scot will evaluate the patient tomorrow. His help is greatly appreciated.  [place] G 11/23/2015, 4:41 PM

## 2015-11-23 NOTE — Op Note (Signed)
NAME:  Randy Bennett, Randy Bennett NO.:  [id number]  MEDICAL RECORD NO.:  [id number]  LOCATION:  [id number]                        FACILITY:  [facility]  PHYSICIAN:  Alta Corning, M.D.   DATE OF BIRTH:  02-13-1956  DATE OF PROCEDURE:  11/22/2015 DATE OF DISCHARGE:                              OPERATIVE REPORT   PREOPERATIVE DIAGNOSIS:  Comminuted distal tibial intra-articular fracture, grade 2 open.  POSTOPERATIVE DIAGNOSIS:  Comminuted distal tibial intra-articular fracture, grade 2 open.  PROCEDURE: 1. Open reduction and internal fixation of grade 2 pilon fracture. 2. Multiplane External fixation, application for maintenance of length, and     reduction of distal tibial intra-articular fracture. 3. Extension of external fixation pins in the foot to help maintain Achilles length. 4. Interpretation of multiple intraoperative fluoroscopic images. 5. Removal of skin, subcutaneous tissue, muscle fascia, and bone from     the site of an open fractures.  SURGEON:  Alta Corning, MD  ASSISTANT:  Gary Fleet, PA  ANESTHESIA:  General.  BRIEF HISTORY:  Randy Bennett is a 59 year old male with a long history of significant of falling off a ladder.  He suffered a distal tibia intra-articular fracture.  X-rays were obtained which showed a severely comminuted distal articular fracture was grade 2 open.  He underwent manipulative closed reduction in the emergency room and he was taken to the operating room urgently for evaluation fixation as needed.  PROCEDURE IN DETAIL:  Patient was taken to the operating room.  After adequate anesthesia was obtained with general anesthetic, patient was placed supine on the operating table.  The left leg was prepped and draped in usual sterile fashion.  Following this, the attention was turned towards the open wound.  We used pickups and scissors and scalpel to remove skin and subcutaneous tissue, muscle fascia, and bone from the side of the open  fracture.  Once this debridement had been undertaken, 3 L of normal saline irrigation was put through the wound with pulsatile lavage irrigator and retractors were used to really get down and really wash this out in front of the tibia, back of the tibia.  We displaced the fracture to get into the end of the tibia and got a very nice wash out here.  Once this was done, we put a transfixion pin through the calcaneus.  We put 2 pins in the tibia.  We then set up a delta type frame, pulling the fracture in a distal direction getting our length maintained.  This was then locked in place.  We did a manipulative reduction of the medial malleolus and put a 6.2 K-wire in this area just the kind of hold that from wanting to move or sublux, got a real near anatomic reduction of the articular surface.  We were very satisfied with that.  We then put the pin in the great metatarsal pin in the 5th metatarsal and attached these to the frame to keep the foot up in the 90 degree position.  At this point, the fluoroscopic images were again taken to make sure that we were satisfied with the reduction and the placement of all the pins and the frame was then tightened thoroughly and then  the tourniquet was let down and a sterile compressive dressing was applied and patient was taken to the recovery, was noted to be in satisfactory condition.  Estimated blood loss for the procedure was minimal.     Alta Corning, M.D.     Corliss Skains  D:  11/22/2015  T:  11/23/2015  Job:  [id number]

## 2015-11-24 ENCOUNTER — Encounter (HOSPITAL_COMMUNITY): Payer: Self-pay | Admitting: Orthopedic Surgery

## 2015-11-24 MED ORDER — TRAMADOL HCL 50 MG PO TABS
50.0000 mg | ORAL_TABLET | Freq: Four times a day (QID) | ORAL | Status: DC | PRN
  Administered 2015-11-25 (×2): 100 mg via ORAL
  Administered 2015-11-26: 50 mg via ORAL
  Administered 2015-11-26 – 2015-11-29 (×10): 100 mg via ORAL
  Administered 2015-11-29: 50 mg via ORAL
  Administered 2015-11-29 – 2015-11-30 (×2): 100 mg via ORAL
  Filled 2015-11-24 (×17): qty 2

## 2015-11-24 MED ORDER — METHOCARBAMOL 1000 MG/10ML IJ SOLN
500.0000 mg | Freq: Four times a day (QID) | INTRAVENOUS | Status: DC
  Administered 2015-11-28: 500 mg via INTRAVENOUS
  Filled 2015-11-24 (×2): qty 5

## 2015-11-24 MED ORDER — OXYCODONE-ACETAMINOPHEN 7.5-325 MG PO TABS: 1.0000 | ORAL_TABLET | Freq: Four times a day (QID) | ORAL | Status: DC | PRN

## 2015-11-24 MED ORDER — PROMETHAZINE HCL 25 MG PO TABS: 25.0000 mg | ORAL_TABLET | Freq: Four times a day (QID) | ORAL | Status: DC | PRN

## 2015-11-24 MED ORDER — CETYLPYRIDINIUM CHLORIDE 0.05 % MT LIQD: 7.0000 mL | Freq: Two times a day (BID) | OROMUCOSAL | Status: DC

## 2015-11-24 MED ORDER — OXYCODONE HCL 5 MG PO TABS
10.0000 mg | ORAL_TABLET | ORAL | Status: DC | PRN
  Administered 2015-11-24 – 2015-11-25 (×2): 20 mg via ORAL
  Filled 2015-11-24 (×2): qty 4

## 2015-11-24 MED ORDER — OXYCODONE HCL 5 MG PO TABS
5.0000 mg | ORAL_TABLET | ORAL | Status: DC | PRN
  Administered 2015-11-24: 10 mg via ORAL
  Filled 2015-11-24: qty 2

## 2015-11-24 MED ORDER — METHOCARBAMOL 500 MG PO TABS
1000.0000 mg | ORAL_TABLET | Freq: Four times a day (QID) | ORAL | Status: DC
  Administered 2015-11-24 – 2015-11-30 (×22): 1000 mg via ORAL
  Filled 2015-11-24 (×25): qty 2

## 2015-11-24 MED ORDER — ENOXAPARIN SODIUM 30 MG/0.3ML ~~LOC~~ SOLN
30.0000 mg | SUBCUTANEOUS | Status: DC
  Administered 2015-11-25 – 2015-11-30 (×5): 30 mg via SUBCUTANEOUS
  Filled 2015-11-24 (×6): qty 0.3

## 2015-11-24 NOTE — Progress Notes (Signed)
ANTICOAGULATION CONSULT NOTE - Initial Consult  Pharmacy Consult for Lovenox Indication: VTE prophylaxis  Allergies  Allergen Reactions  . Lodine [Etodolac] Rash  . Prednisolone Rash    Patient Measurements: Height: 6' (182.9 cm) Weight: 201 lb 14.4 oz (91.581 kg) IBW/kg (Calculated) : 77.6  Vital Signs: Temp: 98.2 F (36.8 C) (12/01 1400) Temp Source: Oral (12/01 1400) BP: 113/67 mmHg (12/01 1400) Pulse Rate: 65 (12/01 1400)  Labs:  Recent Labs  11/22/15 1500  HGB 14.9  HCT 44.0  PLT 285  LABPROT 14.6  INR 1.12  CREATININE 1.55*    Estimated Creatinine Clearance: 57 mL/min (by C-G formula based on Cr of 1.55).   Medical History: Past Medical History  Diagnosis Date  . Viral meningitis 2003  . Bacterial meningitis 2006  . [place] spotted fever 2006  . CSF leak 2006  . Hypertension   . Cancer ([place])     basil cell skin 2000-2001  . Headache   . Shingles   . Meningitis   . Dysphagia   . Hyperlipidemia   . GERD (gastroesophageal reflux disease)     Medications: Prescriptions prior to admission  Medication Sig Dispense Refill Last Dose  . atorvastatin (LIPITOR) 20 MG tablet Take 20 mg by mouth daily at 6 PM.   11/22/2015 at Unknown time  . cetirizine (ZYRTEC) 10 MG tablet Take 10 mg by mouth daily.   11/22/2015 at Unknown time  . diazepam (VALIUM) 5 MG tablet Take 5 mg by mouth every 12 (twelve) hours as needed for anxiety.    Past Week at Unknown time  . HYDROmorphone (DILAUDID) 2 MG tablet Take 2 mg by mouth every 4 (four) hours as needed for severe pain.   Past Week at Unknown time  . ibuprofen (ADVIL,MOTRIN) 800 MG tablet Take 800 mg by mouth every 8 (eight) hours as needed for mild pain.    Past Week at Unknown time  . omeprazole (PRILOSEC) 40 MG capsule Take 40 mg by mouth daily as needed (heartburn).   11 Past Week at Unknown time  . ondansetron (ZOFRAN) 4 MG tablet Take 4 mg by mouth every 8 (eight) hours as needed for nausea or vomiting.    Past Week at Unknown time    Assessment: 59 yo male admitted s/p ladder fall, has grade 2 open L pilon fracture and fibula fracture. He underwent reduction, external fixation and I&D.  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 He has had multiple blood patch repairs for the CSF leak which have all failed. He states after the repairs, he experiences 2-3 days of symptom relief, but after that the symptoms return.  He has chronic headaches and chronic orthostasis. The presence of chronic symptoms and multiple failed repairs leads me to think his CSF leak is more substantial in nature. The use of anticoagulation with a substantial CSF leak is risky, though the risk is small, it puts the patient at risk of having a spinal hematoma which could paralyze him or puts him at risk of having a hemorrhage. On the other hand, due to the patients immobility, fractures and endothelial injury the patient is without a doubt at a high risk of having a thromboembolic event. Another VTE prophylactic option is Eliquis. Eliquis was shown to have lower bleeding risks when compared to LMWH. Since this is a newer agent, we have less experience using Eliquis in this specific circumstance and less options  to reverse Eliquis should that be warranted, I would be cautious with using Eliquis.    Goal of Therapy:  Monitor platelets by anticoagulation protocol: Yes    Plan:  -Lovenox 30 mg [place] daily -Monitor closely for signs of bleeding, cognition, neurological status  -Would also consider using a SCD located on the patients R thigh   Harvel Quale 11/24/2015,3:06 PM

## 2015-11-24 NOTE — Evaluation (Signed)
Physical Therapy Evaluation Patient Details Name: Randy Bennett MRN: [id number] DOB: 1956/10/13 Today's Date: 11/24/2015   History of Present Illness  Patient s/p fall at home, was approximately 10 feet up on ladder, cleaning gutters, when slipped and fell and suffered open distal tib-fib fracture. Pt s/p Open reduction and internal fixation of grade 2 pilon fracture and external fixation (11/23/15).    Clinical Impression  Pt admitted with above diagnosis. Pt currently with functional limitations due to the deficits listed below (see PT Problem List).  Pt will benefit from skilled PT to increase their independence and safety with mobility to allow discharge to the venue listed below.  Pt limited by pain at evaluation, but was able to ambulate ~ 10' with RW with good NWB adherence. Pt liked the walker rather than attempting crutches.  Discussed home entry and at this point he is thinking he will scoot up on his bottom, but going to think about it before next PT session. 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    Follow Up Recommendations No PT follow up;Supervision - Intermittent    Equipment Recommendations  Rolling walker with 5" wheels;Wheelchair (measurements PT)    Recommendations for Other Services OT consult     Precautions / Restrictions Precautions Precaution Comments: external fixator Restrictions Weight Bearing Restrictions: Yes LLE Weight Bearing: Non weight bearing      Mobility  Bed Mobility Overal bed mobility: Needs Assistance Bed Mobility: Supine to Sit     Supine to sit: Min assist     General bed mobility comments: MIN A for L LE only.  Good upper body strength  Transfers Overall transfer level: Needs assistance Equipment used: Rolling walker (2 wheeled) Transfers: Sit to/from Stand Sit to Stand: Min guard         General transfer comment: Pt able to power up  without physical A, min/guard for safety  Ambulation/Gait Ambulation/Gait assistance: Min guard;+2 safety/equipment Ambulation Distance (Feet): 10 Feet Assistive device: Rolling walker (2 wheeled) Gait Pattern/deviations: Step-to pattern Gait velocity: decreased   General Gait Details: Pt able to hop with RW in room , but limited by pain.  Slow deliberate hops and pt did verbalize that he was glad he was using a walker and not crutches. Recliner follow.  Stairs            Wheelchair Mobility    Modified Rankin (Stroke Patients Only)       Balance Overall balance assessment: Needs assistance   Sitting balance-Leahy Scale: Normal       Standing balance-Leahy Scale: Poor Standing balance comment: due to NWB                             Pertinent Vitals/Pain Pain Assessment: 0-10 Pain Score: 6  Pain Location: L ankle Pain Descriptors / Indicators: Aching;Burning;Sharp Pain Intervention(s): Premedicated before session;Repositioned;Patient requesting pain meds-RN notified;Limited activity within patient's tolerance;Monitored during session    [place] expects to be discharged to:: Private residence Living Arrangements: Spouse/significant other;Children   Type of Home: House Home Access: Stairs to enter Entrance Stairs-Rails: None Entrance Stairs-Number of Steps: 2 Home Layout: One level Home Equipment: Crutches      Prior Function Level of Independence: Independent         Comments: Teaches at [employer]  Dominant Hand: Right    Extremity/Trunk Assessment   Upper Extremity Assessment: Defer to OT evaluation           Lower Extremity Assessment: RLE deficits/detail RLE Deficits / Details: R external fixator    Cervical / Trunk Assessment: Normal  Communication   Communication: No difficulties  Cognition Arousal/Alertness: Awake/alert Behavior During Therapy: WFL for tasks  assessed/performed Overall Cognitive Status: Within Functional Limits for tasks assessed                      General Comments General comments (skin integrity, edema, etc.): Discussed options to enter home. backwards with walker vs. crutches vs. scooting up on bottom.  Pt said he would think about it, but thinks based on the set up of the porch, that scooting up on his bottom will be best.  Is going to think about it overnight before tomorrows session.    Exercises        Assessment/Plan    PT Assessment Patient needs continued PT services  PT Diagnosis Difficulty walking;Acute pain   PT Problem List Decreased mobility;Decreased knowledge of use of DME;Decreased knowledge of precautions  PT Treatment Interventions Gait training;Stair training;Functional mobility training;Therapeutic activities;Therapeutic exercise;DME instruction;Patient/family education   PT Goals (Current goals can be found in the Care Plan section) Acute Rehab PT Goals Patient Stated Goal: decrease pain PT Goal Formulation: With patient Time For Goal Achievement: 12/01/15 Potential to Achieve Goals: Good    Frequency Min 3X/week   Barriers to discharge        Co-evaluation               End of Session Equipment Utilized During Treatment: Gait belt Activity Tolerance: Patient limited by pain Patient left: in chair;with call bell/phone within reach Nurse Communication: Mobility status;Patient requests pain meds;Weight bearing status         Time: [id number] PT Time Calculation (min) (ACUTE ONLY): 27 min   Charges:   PT Evaluation $Initial PT Evaluation Tier I: 1 Procedure PT Treatments $Gait Training: 8-22 mins   PT G Codes:        Apryll Hinkle LUBECK 11/24/2015, 10:24 AM

## 2015-11-24 NOTE — Progress Notes (Signed)
Subjective: 2 Days Post-Op Procedure(s) (LRB): EXTERNAL FIXATION LEFT  LEG AND IRRIGATION AND DEBRIDEMENT AND PINNING (Left) Patient reports pain as moderate.  Taking by mouth and voiding okay. Not out of bed yet. Ready to get up with therapy.  Objective: Vital signs in last 24 hours: Temp:  [97.9 F (36.6 C)-98.9 F (37.2 C)] 98 F (36.7 C) (12/01 0525) Pulse Rate:  [72-87] 75 (12/01 0525) Resp:  [14-16] 15 (12/01 0525) BP: (101-122)/(55-80) 101/59 mmHg (12/01 0525) SpO2:  [97 %-100 %] 98 % (12/01 0525)  Intake/Output from previous day: 11/30 0701 - 12/01 0700 In: 3298.8 [P.O.:1840; I.V.:1358.8; IV Piggyback:100] Out: 4180 [Urine:3900; Stool:280] Intake/Output this shift: Total I/O In: -  Out: 400 [Urine:400]   Recent Labs  11/22/15 1500  HGB 14.9    Recent Labs  11/22/15 1500  WBC 14.4*  RBC 4.87  HCT 44.0  PLT 285    Recent Labs  11/22/15 1500  NA 136  K 3.4*  CL 104  CO2 21*  BUN 17  CREATININE 1.55*  GLUCOSE 149*  CALCIUM 9.3    Recent Labs  11/22/15 1500  INR 1.12   Left lower extremity exam: External fixator is intact. No drainage of significance on dressing. Moves toes actively. DP pulse 2+. Good sensation in foot.   Assessment/Plan: 2 Days Post-Op Procedure(s) (LRB): EXTERNAL FIXATION LEFT  LEG AND IRRIGATION AND DEBRIDEMENT AND PINNING (Left) Open left distal tibial/Pilon fracture. Plan: We have discussed the case in detail with Dr. Altamese [place] orthopedic trauma specialist. He'll evaluate the patient and determine treatment plan regarding further surgery etc. In the meantime we will continue him on subcutaneous Lovenox daily for DVT prophylaxis. He can get up with physical therapy nonweightbearing on the left. Continue IV antibiotics due to open fracture. Probable discharge home tomorrow unless surgery entertained on left leg during this hospitalization.   [place] G 11/24/2015, 8:26 AM

## 2015-11-24 NOTE — Evaluation (Signed)
Occupational Therapy Evaluation Patient Details Name: Randy Bennett MRN: [id number] DOB: 1956-01-18 Today's Date: 11/24/2015    History of Present Illness Patient s/p fall at home, was approximately 10 feet up on ladder, cleaning gutters, when slipped and fell and suffered open distal tib-fib fracture. Pt s/p Open reduction and internal fixation of grade 2 pilon fracture and external fixation (11/23/15).     Clinical Impression   Pt was independent prior to admission.  Presents with L LE pain, decreased activity tolerance and impaired balance interfering with ability to perform ADL and mobility. Pt's wife is quite anxious about pt returning home and had many questions.  Pt has friends that will loan a 3 in 1 and tub bench. Instructed wife and pt in home safety and fall prevention. Pt is to have ORIF in 7-10 days and remain NWB on L LE.    Follow Up Recommendations  No OT follow up    Equipment Recommendations  None recommended by OT    Recommendations for Other Services       Precautions / Restrictions Precautions Precautions: Fall Precaution Comments: external fixator Restrictions Weight Bearing Restrictions: Yes LLE Weight Bearing: Non weight bearing      Mobility Bed Mobility   Bed Mobility: Supine to Sit;Sit to Supine     Supine to sit: Min assist Sit to supine: Min assist   General bed mobility comments: MIN A for L LE only.  Good upper body strength  Transfers Overall transfer level: Needs assistance Equipment used: Rolling walker (2 wheeled) Transfers: Sit to/from [facility] Sit to Stand: Min guard Stand pivot transfers: Min assist       General transfer comment: cues for hand and L LE placement    Balance     Sitting balance-Leahy Scale: Normal       Standing balance-Leahy Scale: Poor                              ADL Overall ADL's : Needs assistance/impaired Eating/Feeding: Independent;Sitting   Grooming:  Wash/dry hands;Wash/dry face;Brushing hair;Sitting;Set up   Upper Body Bathing: Set up;Sitting   Lower Body Bathing: Moderate assistance;Sit to/from stand   Upper Body Dressing : Set up;Sitting   Lower Body Dressing: Moderate assistance;Sit to/from stand Lower Body Dressing Details (indicate cue type and reason): instructed to dress L LE first, then R Toilet Transfer: Minimal assistance;Stand-pivot;BSC;RW   Toileting- Clothing Manipulation and Hygiene: Minimal assistance;Sit to/from stand       Functional mobility during ADLs:  (only transfered) General ADL Comments: Instructed pt's wife in fall prevention and removal of throw rugs. Pt likely to have to side step to enter bathroom's narrow door which also has a marble threshold.      Vision     Perception     Praxis      Pertinent Vitals/Pain Pain Assessment: Faces Faces Pain Scale: Hurts even more Pain Location: L groin Pain Descriptors / Indicators: Spasm Pain Intervention(s): Limited activity within patient's tolerance;Ice applied;Monitored during session;Repositioned;Premedicated before session     Hand Dominance Right   Extremity/Trunk Assessment Upper Extremity Assessment Upper Extremity Assessment: Overall WFL for tasks assessed   Lower Extremity Assessment Lower Extremity Assessment: Defer to PT evaluation RLE Deficits / Details: R external fixator   Cervical / Trunk Assessment Cervical / Trunk Assessment: Normal   Communication Communication Communication: No difficulties   Cognition Arousal/Alertness: Awake/alert Behavior During Therapy: WFL for tasks assessed/performed Overall Cognitive  Status: Within Functional Limits for tasks assessed                     General Comments       Exercises       Shoulder Instructions      Home Living Family/patient expects to be discharged to:: Private residence Living Arrangements: Spouse/significant other;Children Available Help at Discharge:  Family;Available 24 hours/day Type of Home: House Home Access: Stairs to enter [employer] of Steps: 2 Entrance Stairs-Rails: None Home Layout: One level     Bathroom Shower/Tub: Teacher, early years/pre: Standard     Home Equipment: Crutches   Additional Comments: Pt has access to tub transfer bench and 3 in 1.      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    OT Diagnosis: Generalized weakness;Acute pain   OT Problem List: Decreased strength;Decreased activity tolerance;Impaired balance (sitting and/or standing);Pain;Decreased knowledge of use of DME or AE;Decreased knowledge of precautions;Decreased safety awareness   OT Treatment/Interventions: Self-care/ADL training;DME and/or AE instruction;Balance training;Patient/family education;Therapeutic activities    OT Goals(Current goals can be found in the care plan section) Acute Rehab OT Goals Patient Stated Goal: decrease pain OT Goal Formulation: With patient Time For Goal Achievement: 12/01/15 Potential to Achieve Goals: Good ADL Goals Pt Will Perform Grooming: with supervision;standing (for hand washing) Pt Will Perform Lower Body Bathing: with min assist;sit to/from stand Pt Will Perform Lower Body Dressing: with min assist;sit to/from stand Pt Will Transfer to Toilet: with supervision;ambulating;bedside commode (over toilet) Pt Will Perform Toileting - Clothing Manipulation and hygiene: with supervision;sit to/from stand Additional ADL Goal #1: Pt will side step through threshold of bathroom with supervision.  OT Frequency: Min 2X/week   Barriers to D/C:            Co-evaluation              End of Session Equipment Utilized During Treatment: Gait belt;Rolling walker  Activity Tolerance: Patient limited by pain Patient left: in bed;with call bell/phone within reach;with  family/visitor present   Time: [id number] OT Time Calculation (min): 45 min Charges:  OT General Charges $OT Visit: 1 Procedure OT Evaluation $Initial OT Evaluation Tier I: 1 Procedure OT Treatments $Self Care/Home Management : 23-37 mins G-Codes:    Malka So 11/24/2015, 4:16 PM  [phone]

## 2015-11-24 NOTE — Consult Note (Signed)
Orthopaedic Trauma Service (OTS) Consult   Reason for Consult: Open Left Pilon fracture (tibia and fibula) Referring Physician: Devonne Doughty, MD    HPI: Randy Bennett is an 59 y.o. white male who fell off of a ladder, approximately 5 feet on 11/22/2015. Patient states it was when out and his ladder slipped while he was cleaning the gutters. Everything happened quite quickly and he landed on the concrete below. Patient noted immediate deformity and saw bone sticking out. He held his leg stable until EMS Arrived which was approximately 20 minutes. Patient states that he was wearing long pants as well as a leather slip on loafers and tubes socks. [facility]. Patient was found to have an isolated open left distal tibia fracture as well as a fibula fracture. The on-call orthopedic surgeon was Dr. Berenice Primas. Patient was taken urgently to the operating room for irrigation and debridement of his open wounds and bone as well as application of a spanning external fixator. Due to the complexity of the injury the orthopedic trauma service was consult and for definitive management.  Patient seen on 6 N. 21 he is uncomfortable but is complaining of some left leg pain. He feels sore all over but the pain is primarily focused around his left ankle. He is able to move all his extremities without difficulty. Denies any head or neck pain. Denies blacking out prior to or after the accident. Patient denies any numbness or tingling in his left lower extremity. Pain is relieved with rest. He gets very mild relief with oral Dilaudid. He is having some mild muscle spasms to his left thigh as well.   Patient's medical history is notable for chronic CSF leak which was a complication of his [place] spotted fever which led to meningitis. He has been managed with numerous patches and so forth and several surgeries all of which have not resulted in any long-standing success. Currently  he is managed conservatively by physician at [facility]. Patient states that his last patch was about 4 years ago   Patient is retired from the [employer] but he still teaches criminal justice at [employer]    Patient lives in [place] with his wife. Single level house with 3-4 steps to enter the dwelling  Patient does not smoke, does not drink no other drugs  Past Medical History  Diagnosis Date  . Viral meningitis 2003  . Bacterial meningitis 2006  . [place] spotted fever 2006  . CSF leak 2006  . Hypertension   . Cancer ([place])     basil cell skin 2000-2001  . Headache   . Shingles   . Meningitis   . Dysphagia   . Hyperlipidemia   . GERD (gastroesophageal reflux disease)     Past Surgical History  Procedure Laterality Date  . Appendectomy  1970  . Laminectomy  [id number]  . Knee arthroscopy with anterior cruciate ligament (acl) repair Bilateral 1993 (L), 1992 (R)  . Skin cancer removal  2000-2001  . Colonoscopy    . Esophagogastroduodenoscopy    . Colonoscopy with propofol N/A 07/01/2015    Procedure: COLONOSCOPY WITH PROPOFOL;  Surgeon: Manya Silvas, MD;  Location: [facility] ENDOSCOPY;  Service: Endoscopy;  Laterality: N/A;  . Esophagogastroduodenoscopy N/A 07/01/2015    Procedure: ESOPHAGOGASTRODUODENOSCOPY (EGD);  Surgeon: Manya Silvas, MD;  Location: [facility] ENDOSCOPY;  Service: Endoscopy;  Laterality: N/A;  . Savory dilation N/A 07/01/2015    Procedure: SAVORY  DILATION;  Surgeon: Manya Silvas, MD;  Location: [facility] ENDOSCOPY;  Service: Endoscopy;  Laterality: N/A;  . Orif ankle fracture Left 11/22/2015  . External fixation leg Left 11/22/2015    Procedure: EXTERNAL FIXATION LEFT  LEG AND IRRIGATION AND DEBRIDEMENT AND PINNING;  Surgeon: Dorna Leitz, MD;  Location: [place];  Service: Orthopedics;  Laterality: Left;    Family History  Problem Relation Age of Onset  . Cancer Mother     Social History:  reports that he has  never smoked. He has never used smokeless tobacco. He reports that he does not drink alcohol or use illicit drugs.  Allergies:  Allergies  Allergen Reactions  . Lodine [Etodolac] Rash  . Prednisolone Rash    Medications:  I have reviewed the patient's current medications. Prior to Admission:  Prescriptions prior to admission  Medication Sig Dispense Refill Last Dose  . atorvastatin (LIPITOR) 20 MG tablet Take 20 mg by mouth daily at 6 PM.   11/22/2015 at Unknown time  . cetirizine (ZYRTEC) 10 MG tablet Take 10 mg by mouth daily.   11/22/2015 at Unknown time  . diazepam (VALIUM) 5 MG tablet Take 5 mg by mouth every 12 (twelve) hours as needed for anxiety.    Past Week at Unknown time  . HYDROmorphone (DILAUDID) 2 MG tablet Take 2 mg by mouth every 4 (four) hours as needed for severe pain.   Past Week at Unknown time  . ibuprofen (ADVIL,MOTRIN) 800 MG tablet Take 800 mg by mouth every 8 (eight) hours as needed for mild pain.    Past Week at Unknown time  . omeprazole (PRILOSEC) 40 MG capsule Take 40 mg by mouth daily as needed (heartburn).   11 Past Week at Unknown time  . ondansetron (ZOFRAN) 4 MG tablet Take 4 mg by mouth every 8 (eight) hours as needed for nausea or vomiting.   Past Week at Unknown time    Results for orders placed or performed during the hospital encounter of 11/22/15 (from the past 48 hour(s))  CBC     Status: Abnormal   Collection Time: 11/22/15  3:00 PM  Result Value Ref Range   WBC 14.4 (H) 4.0 - 10.5 K/uL   RBC 4.87 4.22 - 5.81 MIL/uL   Hemoglobin 14.9 13.0 - 17.0 g/dL   HCT 44.0 39.0 - 52.0 %   MCV 90.3 78.0 - 100.0 fL   MCH 30.6 26.0 - 34.0 pg   MCHC 33.9 30.0 - 36.0 g/dL   RDW 12.8 11.5 - 15.5 %   Platelets 285 150 - 400 K/uL  Basic metabolic panel     Status: Abnormal   Collection Time: 11/22/15  3:00 PM  Result Value Ref Range   Sodium 136 135 - 145 mmol/L   Potassium 3.4 (L) 3.5 - 5.1 mmol/L   Chloride 104 101 - 111 mmol/L   CO2 21 (L) 22 - 32  mmol/L   Glucose, Bld 149 (H) 65 - 99 mg/dL   BUN 17 6 - 20 mg/dL   Creatinine, Ser 1.55 (H) 0.61 - 1.24 mg/dL   Calcium 9.3 8.9 - 10.3 mg/dL   GFR calc non Af Amer 48 (L) >60 mL/min   GFR calc Af Amer 55 (L) >60 mL/min    Comment: (NOTE) The eGFR has been calculated using the CKD EPI equation. This calculation has not been validated in all clinical situations. eGFR's persistently <60 mL/min signify possible Chronic Kidney Disease.    Anion gap 11 5 -  15  Protime-INR     Status: None   Collection Time: 11/22/15  3:00 PM  Result Value Ref Range   Prothrombin Time 14.6 11.6 - 15.2 seconds   INR 1.12 0.00 - 1.49  Type and screen     Status: None   Collection Time: 11/22/15  3:50 PM  Result Value Ref Range   ABO/RH(D) O POS    Antibody Screen NEG    Sample Expiration 11/25/2015   ABO/Rh     Status: None   Collection Time: 11/22/15  3:50 PM  Result Value Ref Range   ABO/RH(D) O POS     Dg Thoracic Spine 2 View  11/22/2015  CLINICAL DATA:  Status post fall.  Generalized back pain. EXAM: THORACIC SPINE 2 VIEWS COMPARISON:  None. FINDINGS: There is no evidence of thoracic spine fracture. Alignment is normal. No other significant bone abnormalities are identified. IMPRESSION: Negative. Electronically Signed   By: Kathreen Devoid   On: 11/22/2015 18:24   Dg Lumbar Spine 2-3 Views  11/22/2015  CLINICAL DATA:  Status post fall from 10 feet.  Back pain. EXAM: LUMBAR SPINE - 2-3 VIEW COMPARISON:  CT abdomen 07/06/2014 FINDINGS: The vertebral body heights are maintained. Alignment is normal. Intervertebral disc spaces are maintained. IMPRESSION: No acute osseous injury of the lumbar spine. Electronically Signed   By: Kathreen Devoid   On: 11/22/2015 18:27   Dg Tibia/fibula Left  11/22/2015  CLINICAL DATA:  Left tibial plateau fracture EXAM: LEFT TIBIA AND FIBULA - 2 VIEW; DG C-ARM 61-120 MIN COMPARISON:  None FLUOROSCOPY TIME:  62.2 seconds FINDINGS: Interval placement of a left lower leg ex fix  device stabilizing a comminuted distal tibial fracture. There is a K-wire transfixing the medial malleolar fracture. There is a displaced and comminuted distal fibular diaphysis fracture. The ankle mortise is in near anatomic alignment. IMPRESSION: Interval placement of left lower leg ex fix device stabilizing a comminuted distal tibial fracture. Interval placement of a K-wire transfixing a medial malleolar fracture. Electronically Signed   By: Kathreen Devoid   On: 11/22/2015 23:14   Dg Tibia/fibula Left  11/22/2015  CLINICAL DATA:  Status post fall from 06/08 foot height while on a ladder, open fracture deformity of the distal left leg. EXAM: LEFT TIBIA AND FIBULA - 2 VIEW COMPARISON:  None in PACs FINDINGS: The patient has sustained an acute comminuted fracture of the distal left tibial metaphysis. A fracture line extends through the articular surface of the distal tibia. There is apex medial angulation at the fracture site. There is a comminuted angulated and displaced fracture of the distal third of the shaft of the left fibula with overriding of fracture fragments. There is disruption of the lateral aspect of the ankle joint mortise. More proximally the shafts of the tibia and fibula are intact. The limited visualization of the knee reveals mild degenerative change but no acute fracture. An open wound over the medial malleolar region is present. IMPRESSION: Opened, comminuted angulated and displaced fractures of the distal tibia and fibula as described. The distal tibial fracture line extends to the articular surface with the talus. Electronically Signed   By: David  Martinique M.D.   On: 11/22/2015 16:32   Dg Ankle Complete Left  11/23/2015  CLINICAL DATA:  Pt with pilon fracture of left ankle, came in last night and xfix placed. Pt fell off of ladder yesterday. EXAM: LEFT ANKLE COMPLETE - 3+ VIEW COMPARISON:  11/21/14 FINDINGS: Unchanged fibular shaft comminuted and displaced fracture.  Comminuted fracture  distal tibial metaphysis and epiphysis, with a pin through the medial malleolus. Improved position of fracture fragments distal tibia with approximate anatomic positioning. Mortise relatively symmetric as compared to previous. IMPRESSION: ORIF distal tibia fracture. Electronically Signed   By: Skipper Cliche M.D.   On: 11/23/2015 13:00   Ct Cervical Spine Wo Contrast  11/22/2015  CLINICAL DATA:  59 year old male status post fall from a ladder. Patient denies neck pain. EXAM: CT CERVICAL SPINE WITHOUT CONTRAST TECHNIQUE: Multidetector CT imaging of the cervical spine was performed without intravenous contrast. Multiplanar CT image reconstructions were also generated. COMPARISON:  None. FINDINGS: There is no acute fracture or subluxation of the cervical spine.There mild degenerative changes most prominent at C6-C7 where there is endplate irregularity and disc space narrowing.The odontoid and spinous processes are intact.There is normal anatomic alignment of the C1-C2 lateral masses. The visualized soft tissues appear unremarkable. There is mild mucoperiosteal thickening of the visualized paranasal sinuses. The included mastoid air cells are clear. IMPRESSION: No acute fracture or subluxation. Electronically Signed   By: Anner Crete M.D.   On: 11/22/2015 18:22   Dg Pelvis Portable  11/22/2015  CLINICAL DATA:  Status post 68 foot fall from a ladder while cleaning gutter. Patient has open fracture of the left tibia and fibula. EXAM: PORTABLE PELVIS 1-2 VIEWS COMPARISON:  None. FINDINGS: Exam is limited evaluation of the proximal left femur due to rotation of the femur. No gross displacement is identified. IMPRESSION: Exam is limited evaluation of the proximal left femur due to rotation of the femur. No gross displacement is identified. Electronically Signed   By: Abelardo Diesel M.D.   On: 11/22/2015 16:28   Ct Ankle Left Wo Contrast  11/23/2015  CLINICAL DATA:  Status post fall from a ladder on  11/22/2015 with open distal tibial and fibular fractures. Status post external fixator placement 11/22/2015. Preoperative planning examination. Initial encounter. EXAM: CT OF THE LEFT ANKLE WITHOUT CONTRAST TECHNIQUE: Multidetector CT imaging of the left ankle was performed according to the standard protocol. Multiplanar CT image reconstructions were also generated. COMPARISON:  Plain films 11/22/2015. FINDINGS: The patient has a comminuted distal tibial fracture extending to the plafond and is an external fixator. The superior margin of the fracture is through the lateral cortex 3.8 cm above the joint line. The fracture has an oblique component extending through the medial cortex of the tibia 3 cm above the medial malleolus. A segment of the anterior cortex of the distal tibia centered at 3.5 cm above the plafond and measuring 1.9 cm transverse by 1.4 cm AP is depressed into the medullary canal. The plafond is divided into 4 main fragments. There is a fracture line in the coronal plane through the junction of the middle and anterior thirds of the plafond. Fragment off the anterior, lateral corner of the plafond and measuring 1.5 cm AP by 1.2 cm transverse is laterally displaced 1.5 cm at the anterior cortex and 0.8 cm from the adjacent posterior fracture fragment. The posterior, lateral aspect of the plafond involving the fragment measuring 2.5 cm AP by 2.2 cm transverse is laterally displaced 0.4 cm and posteriorly displace 0.7 cm. The 2 more medial and anterior fracture fragments are nondisplaced. There is a fixation pin through the patient's medial malleolar fracture with near anatomic position and alignment of the medial malleolus. The patient also has a comminuted segmental fracture of the distal fibula. The superior most fracture line is 14.5 cm above the tip of the lateral  malleolus and the second more inferior fracture line is 10.5 cm above the tip of the lateral malleolus. The mid fracture fragment shows 1  shaft with lateral displacement and mild fragment override. No other fracture is identified. Hematoma is present about the patient's fracture. Gas in the soft tissues is consistent with an open injury. The tibialis posterior tendon passes adjacent to fracture fragments of the distal tibia but does not appear entrapped. Tendons are otherwise unremarkable. Ligaments cannot be adequately assessed due to streak from the patient's external fixator. IMPRESSION: Pilon tibial fracture and segmental distal fibular fracture is described above. As noted above, the tibialis posterior tendon passes adjacent to fracture fragments but does not appear entrapped. 3-dimensional CT images were rendered by post-processing of the original CT data at the CT scanner. The 3-dimensional CT images were interpreted, and findings were reported in the accompanying complete CT report for this study. Electronically Signed   By: Inge Rise M.D.   On: 11/23/2015 14:31   Ct 3d Recon At Scanner  11/23/2015  CLINICAL DATA:  Status post fall from a ladder on 11/22/2015 with open distal tibial and fibular fractures. Status post external fixator placement 11/22/2015. Preoperative planning examination. Initial encounter. EXAM: CT OF THE LEFT ANKLE WITHOUT CONTRAST TECHNIQUE: Multidetector CT imaging of the left ankle was performed according to the standard protocol. Multiplanar CT image reconstructions were also generated. COMPARISON:  Plain films 11/22/2015. FINDINGS: The patient has a comminuted distal tibial fracture extending to the plafond and is an external fixator. The superior margin of the fracture is through the lateral cortex 3.8 cm above the joint line. The fracture has an oblique component extending through the medial cortex of the tibia 3 cm above the medial malleolus. A segment of the anterior cortex of the distal tibia centered at 3.5 cm above the plafond and measuring 1.9 cm transverse by 1.4 cm AP is depressed into the  medullary canal. The plafond is divided into 4 main fragments. There is a fracture line in the coronal plane through the junction of the middle and anterior thirds of the plafond. Fragment off the anterior, lateral corner of the plafond and measuring 1.5 cm AP by 1.2 cm transverse is laterally displaced 1.5 cm at the anterior cortex and 0.8 cm from the adjacent posterior fracture fragment. The posterior, lateral aspect of the plafond involving the fragment measuring 2.5 cm AP by 2.2 cm transverse is laterally displaced 0.4 cm and posteriorly displace 0.7 cm. The 2 more medial and anterior fracture fragments are nondisplaced. There is a fixation pin through the patient's medial malleolar fracture with near anatomic position and alignment of the medial malleolus. The patient also has a comminuted segmental fracture of the distal fibula. The superior most fracture line is 14.5 cm above the tip of the lateral malleolus and the second more inferior fracture line is 10.5 cm above the tip of the lateral malleolus. The mid fracture fragment shows 1 shaft with lateral displacement and mild fragment override. No other fracture is identified. Hematoma is present about the patient's fracture. Gas in the soft tissues is consistent with an open injury. The tibialis posterior tendon passes adjacent to fracture fragments of the distal tibia but does not appear entrapped. Tendons are otherwise unremarkable. Ligaments cannot be adequately assessed due to streak from the patient's external fixator. IMPRESSION: Pilon tibial fracture and segmental distal fibular fracture is described above. As noted above, the tibialis posterior tendon passes adjacent to fracture fragments but does not appear entrapped.  3-dimensional CT images were rendered by post-processing of the original CT data at the CT scanner. The 3-dimensional CT images were interpreted, and findings were reported in the accompanying complete CT report for this study.  Electronically Signed   By: Inge Rise M.D.   On: 11/23/2015 14:31   Dg Chest Port 1 View  11/22/2015  CLINICAL DATA:  59 year old who fell approximately 6-8 feet from a ladder earlier today while cleaning gutters, sustaining an open compound fracture of the left tibia-fibula. Preoperative respiratory evaluation. EXAM: PORTABLE CHEST 1 VIEW COMPARISON:  04/29/2007. FINDINGS: Cardiac silhouette likely upper normal in size for AP portable technique. Hilar and mediastinal contours otherwise unremarkable. Lungs clear. Bronchovascular markings normal. Pulmonary vascularity normal. No visible pleural effusions. No pneumothorax. IMPRESSION: No acute cardiopulmonary disease. Electronically Signed   By: Evangeline Dakin M.D.   On: 11/22/2015 16:29   Dg C-arm 1-60 Min  11/22/2015  CLINICAL DATA:  Left tibial plateau fracture EXAM: LEFT TIBIA AND FIBULA - 2 VIEW; DG C-ARM 61-120 MIN COMPARISON:  None FLUOROSCOPY TIME:  62.2 seconds FINDINGS: Interval placement of a left lower leg ex fix device stabilizing a comminuted distal tibial fracture. There is a K-wire transfixing the medial malleolar fracture. There is a displaced and comminuted distal fibular diaphysis fracture. The ankle mortise is in near anatomic alignment. IMPRESSION: Interval placement of left lower leg ex fix device stabilizing a comminuted distal tibial fracture. Interval placement of a K-wire transfixing a medial malleolar fracture. Electronically Signed   By: Kathreen Devoid   On: 11/22/2015 23:14    Review of Systems  Constitutional: Negative for fever and chills.  Eyes: Negative for blurred vision and double vision.  Respiratory: Negative for shortness of breath and wheezing.   Cardiovascular: Negative for chest pain and palpitations.  Gastrointestinal: Negative for nausea, vomiting and abdominal pain.  Genitourinary: Negative for dysuria.  Musculoskeletal:       Left ankle pain   Neurological: Negative for tingling, sensory change  and headaches.   Blood pressure 114/57, pulse 64, temperature 98.1 F (36.7 C), temperature source Oral, resp. rate 12, height 6' (1.829 m), weight 91.581 kg (201 lb 14.4 oz), SpO2 99 %.  Body mass index is 27.38 kg/(m^2).  Physical Exam  Constitutional: He is oriented to person, place, and time. He appears well-developed and well-nourished. He is cooperative. No distress.  HENT:  Head: Normocephalic and atraumatic.  Mouth/Throat: Oropharynx is clear and moist and mucous membranes are normal.  Neck: Normal range of motion and full passive range of motion without pain. Neck supple. No spinous process tenderness and no muscular tenderness present. Normal range of motion present.  Cardiovascular: Normal rate, regular rhythm, S1 normal and S2 normal.   Pulmonary/Chest: Effort normal and breath sounds normal. No accessory muscle usage. No respiratory distress.  Abdominal: Soft. Normal appearance and bowel sounds are normal. There is no tenderness. There is no guarding.  Musculoskeletal:  Pelvis--no traumatic wounds or rash, no ecchymosis, stable to manual stress, nontender   Left Lower Extremity  Inspection:   Spanning ex fix Left ankle   Ex fix is stable   Dressing stable    Hip w/o acute findings   Small abrasion anterior knee Bony eval:   Knee and hip nontender   Ankle tender with palpation   Foot nontender Soft tissue:   Moderate swelling to L ankle   Dressings removed   Medial wound stable. No erythema, no signs of infection   pinsites look good  Skin does not wrinkle with gentle compression anteriorly   Mod swelling medial ankle   K-wire in medial malleolus is stable ROM:   Ankle ROM not assessed as pt in fixator   Good active ROM of toes   Knee ROM is good  Sensation:   DPN, SPN, TN sensation grossly intact   Motor:   EHL, FHL, lesser toe motor functions intact   Pt lifting L leg up w/o difficulty    Good active knee extension and SLR  Vascular:   + DP pulse    Compartments soft   No pain with passive stretch    Ext is warm   RLE No traumatic wounds, ecchymosis, or rash  Nontender  No effusions             No pain with axial loading or log rolling of R hip  Knee stable to varus/ valgus and anterior/posterior stress             Ankle nontender, stable to ligamentous stressing   Sens DPN, SPN, TN intact  Motor EHL, ext, flex, evers 5/5  DP 2+, No significant edema  BUEx shoulder, elbow, wrist, digits- no skin wounds, nontender, no instability, no blocks to motion  Sens  Ax/R/M/U intact  Mot   Ax/ R/ PIN/ M/ AIN/ U intact  Rad 2+      Neurological: He is alert and oriented to person, place, and time.  Psychiatric: He has a normal mood and affect. His speech is normal and behavior is normal. Judgment and thought content normal. Cognition and memory are normal.  Nursing note and vitals reviewed.      Assessment/Plan:  59 year old white male s/p fall off ladder proximal 5 feet with open left pilon fracture  1. Fall off ladder  2. Open left Pilon fracture, tibia and fibula, s/p external fixation and I&D  The orthopedic trauma service will assume management of the patient due to the complexity of his injury(   All wounds currently looks stable, I do not feel that the patient needs another washout at this time  Swelling is still moderate and there is no wrinkling of the skin in anticipated area of surgery  Feel that it will likely be several days before we can proceed with definitive fixation.  All imaging has been reviewed and we feel that the best approach would be anteriorly.   Dressings were changed: Kerlix around pin sites, Adaptic over traumatic wound, 4 x 4 underneath K wire and then a soft dressing over foot and ankle including Ace wrap   Patient will continue with aggressive ice and elevation above the heart, as well as toe motion to help with edema control    PT and OT consults   Nonweightbearing left leg   Patient is at  risk for complications with his injury including posttraumatic arthritis as this was an intra-articular injury. He is also at risk for deep infection as it was an open injury, which could lead to amputation however patient does not have concerning comorbidities such as nicotine dependence or diabetes which would further increases risk of complication.  3. Pain management:   Will adjust his pain medication   Percocet 7.5/325 1-2 po Q6h prn   OxyIR 5 mg 1-2 po q3h prn   Robaxin 1000 mg every 6 hours scheduled   Dilaudid 1-2 mg IV q3h prn severe breakthrough pain  4. ABL anemia/Hemodynamics   stable  5. Medical issues   are chronic CSF leak, managed  conservatively. Physician is at [facility]  6. DVT/PE prophylaxis:   Patient is currently on Lovenox  We'll need to review data on chronic CSF leak and Lovenox use- pharmacy consult  7.ID:   Patient on scheduled Ancef for open fracture treatment  Continue for another 24 hours  8.Metabolic Bone Disease:   Check 25-hydroxy vitamin D level  9 Activity:   Out of bed with assist  Nonweightbearing left leg- crutches or walker  10. FEN/Foley/Lines:   Advanced diet as tolerated  11.Ex-fix/Splint care:   Okay to manipulate leg by fixator  Will review pin care with husband and wife again tomorrow  Daily or every other day dressing changes depending on drainage. Clean pin sites with soap and water. Okay to shower once a traumatic wound is stable   12. Dispo:   PT and OT consults  probable discharge home tomorrow with follow-up at the office on Wednesday and possible return to the OR late next week early following week   Jari Pigg, PA-C [facility] [phone] (P) 11/24/2015, 10:45 AM

## 2015-11-25 ENCOUNTER — Ambulatory Visit: Payer: BLUE CROSS/BLUE SHIELD

## 2015-11-25 LAB — COMPREHENSIVE METABOLIC PANEL
ALT: 67 U/L — ABNORMAL HIGH (ref 17–63)
AST: 76 U/L — ABNORMAL HIGH (ref 15–41)
Albumin: 3 g/dL — ABNORMAL LOW (ref 3.5–5.0)
Alkaline Phosphatase: 65 U/L (ref 38–126)
Anion gap: 9 (ref 5–15)
BUN: 12 mg/dL (ref 6–20)
CO2: 30 mmol/L (ref 22–32)
Calcium: 8.5 mg/dL — ABNORMAL LOW (ref 8.9–10.3)
Chloride: 99 mmol/L — ABNORMAL LOW (ref 101–111)
Creatinine, Ser: 1.2 mg/dL (ref 0.61–1.24)
GFR calc Af Amer: >60 mL/min (ref >60)
GFR calc non Af Amer: >60 mL/min (ref >60)
Glucose, Bld: 105 mg/dL — ABNORMAL HIGH (ref 65–99)
Potassium: 3.5 mmol/L (ref 3.5–5.1)
Sodium: 138 mmol/L (ref 135–145)
Total Bilirubin: 0.8 mg/dL (ref 0.3–1.2)
Total Protein: 6 g/dL — ABNORMAL LOW (ref 6.5–8.1)

## 2015-11-25 LAB — CBC
HCT: 37 % — ABNORMAL LOW (ref 39.0–52.0)
Hemoglobin: 11.7 g/dL — ABNORMAL LOW (ref 13.0–17.0)
MCH: 29.8 pg (ref 26.0–34.0)
MCHC: 31.6 g/dL (ref 30.0–36.0)
MCV: 94.1 fL (ref 78.0–100.0)
Platelets: 219 10*3/uL (ref 150–400)
RBC: 3.93 MIL/uL — ABNORMAL LOW (ref 4.22–5.81)
RDW: 12.8 % (ref 11.5–15.5)
WBC: 10.9 10*3/uL — ABNORMAL HIGH (ref 4.0–10.5)

## 2015-11-25 MED ORDER — TAPENTADOL HCL 50 MG PO TABS
50.0000 mg | ORAL_TABLET | Freq: Four times a day (QID) | ORAL | Status: DC | PRN
  Administered 2015-11-25 – 2015-11-29 (×13): 100 mg via ORAL
  Administered 2015-11-29 (×2): 50 mg via ORAL
  Administered 2015-11-30 (×3): 100 mg via ORAL
  Filled 2015-11-25 (×18): qty 2

## 2015-11-25 MED ORDER — OXYCODONE HCL 5 MG PO TABS
5.0000 mg | ORAL_TABLET | ORAL | Status: DC | PRN
  Administered 2015-11-25 – 2015-11-27 (×3): 5 mg via ORAL
  Administered 2015-11-28 – 2015-11-30 (×6): 10 mg via ORAL
  Filled 2015-11-25: qty 1
  Filled 2015-11-25 (×4): qty 2
  Filled 2015-11-25: qty 1
  Filled 2015-11-25: qty 2
  Filled 2015-11-25: qty 1
  Filled 2015-11-25: qty 2

## 2015-11-25 NOTE — Progress Notes (Signed)
Pt a/o, c/o left ankle pain PRN pain meds as ordered, pain management has been good this evening, pt resting comfortably in bed with left leg elevated and ice applied, VSS, pt stable

## 2015-11-25 NOTE — Progress Notes (Signed)
Occupational Therapy Treatment Patient Details Name: Randy Bennett MRN: [id number] DOB: 03-11-1956 Today's Date: 11/25/2015    History of present illness Patient s/p fall at home, was approximately 10 feet up on ladder, cleaning gutters, when slipped and fell and suffered open distal tib-fib fracture. Pt s/p Open reduction and internal fixation of grade 2 pilon fracture and external fixation (11/23/15).     OT comments  OT to hold further treatment until after surgery on 11/28/15. Pt progressing well. Wife present and questions answered using pictures from home.    Follow Up Recommendations  No OT follow up    Equipment Recommendations  None recommended by OT    Recommendations for Other Services      Precautions / Restrictions Precautions Precautions: Fall Precaution Comments: external fixator Restrictions Weight Bearing Restrictions: Yes LLE Weight Bearing: Non weight bearing       Mobility Bed Mobility         Supine to sit: Supervision;HOB elevated     General bed mobility comments: Pt. working with OT upon entry, placed in recliner at end of session.    Transfers Overall transfer level: Needs assistance Equipment used: Rolling walker (2 wheeled) Transfers: Sit to/from Stand Sit to Stand: Min guard         General transfer comment: Min guard for safety and control.  Pt. relies heavily on UE but appears to have good UE strength.      Balance Overall balance assessment: Needs assistance Sitting-balance support: Feet supported Sitting balance-Leahy Scale: Normal     Standing balance support: Bilateral upper extremity supported Standing balance-Leahy Scale: Poor Standing balance comment: RW for support.                   ADL Overall ADL's : Needs assistance/impaired Eating/Feeding: Independent   Grooming: Wash/dry hands;Wash/dry face;Oral care;Applying deodorant;Brushing hair;Supervision/safety;Sitting (shaving) Grooming Details (indicate  cue type and reason): pt seated on 3n1 to complete task with foot elevated on trash can Upper Body Bathing: Supervision/ safety;Sitting   Lower Body Bathing: Supervison/ safety;Sitting/lateral leans           Toilet Transfer: Minimal assistance;RW;BSC;Ambulation Toilet Transfer Details (indicate cue type and reason): pt will have one for home use. Educated wife on positioning/ adjusting/ and use of bucket         Functional mobility during ADLs: Minimal assistance;Rolling walker General ADL Comments: wife with questions about home. Educated on bench in shower with pictures wife provided. educated on wedge that wife has purchased, car transfers, positioning on couch and animal care in house. Pt could benefit from reacher at home and wife considering purchase      Vision                     Perception     Praxis      Cognition   Behavior During Therapy: [facility] for tasks assessed/performed Overall Cognitive Status: Within Functional Limits for tasks assessed                       Extremity/Trunk Assessment               Exercises Total Joint Exercises Towel Squeeze: AROM;10 reps;Seated General Exercises - Lower Extremity Ankle Circles/Pumps: AROM;Right;20 reps;Seated Quad Sets: AROM;Left;10 reps;Seated Hip ABduction/ADduction: AAROM;Left;10 reps;Seated Straight Leg Raises: AAROM;Left;10 reps;Seated   Shoulder Instructions       General Comments      Pertinent Vitals/ Pain  Pain Assessment: 0-10 Pain Score: 9  Faces Pain Scale: Hurts little more Pain Location: L foot Pain Descriptors / Indicators: Aching;Grimacing;Pounding Pain Intervention(s): Limited activity within patient's tolerance;Monitored during session;Repositioned;RN gave pain meds during session  Home Living                                          Prior Functioning/Environment              Frequency Min 2X/week     Progress Toward Goals  OT  Goals(current goals can now be found in the care plan section)  Progress towards OT goals: Progressing toward goals  Acute Rehab OT Goals Patient Stated Goal: decrease pain OT Goal Formulation: With patient Time For Goal Achievement: 12/01/15 Potential to Achieve Goals: Good ADL Goals Pt Will Perform Grooming: with supervision;standing Pt Will Perform Lower Body Bathing: with min assist;sit to/from stand Pt Will Perform Lower Body Dressing: with min assist;sit to/from stand Pt Will Transfer to Toilet: with supervision;ambulating;bedside commode Pt Will Perform Toileting - Clothing Manipulation and hygiene: with supervision;sit to/from stand Additional ADL Goal #1: Pt will side step through threshold of bathroom with supervision.  Plan Discharge plan remains appropriate    Co-evaluation                 End of Session Equipment Utilized During Treatment: Gait belt;Rolling walker   Activity Tolerance Patient tolerated treatment well   Patient Left Other (comment) (with PT REBECCA)   Nurse Communication Mobility status;Precautions        Time: [id number] OT Time Calculation (min): 49 min  Charges: OT General Charges $OT Visit: 1 Procedure OT Treatments $Self Care/Home Management : 38-52 mins  Parke Poisson B 11/25/2015, 3:17 PM   Jeri Modena   OTR/L Pager: [phone] Office: [phone] .

## 2015-11-25 NOTE — Clinical Documentation Improvement (Deleted)
Internal Medicine       Please provide clinical indicators, historical, or baseline comparative data to support your documented diagnosis of "HIV" and or provide such as: lab values, treatment plan, etc   Possible clinical condition  AIDS  HIV disease  Asymptomatic HIV   or  If the diagnosis is no longer applicable, amend your documentation as appropriate.   Please exercise your independent, professional judgment when responding. A specific answer is not anticipated or expected.   Thank You, Chenise Mulvihill J Cattie Tineo [facility] [place] [phone]     

## 2015-11-25 NOTE — Progress Notes (Signed)
Chuck under left foot changed and dressing reinforced for second time this shift with serosanguinous drainage, PRN pain meds given as ordered

## 2015-11-25 NOTE — Progress Notes (Signed)
[facility]thopaedic [facility]uma [facility] (OTS)   Subjective: Patient reports pain as severe, but improving. Unable to sleep.  Objective: Temp:  [98.2 F (36.8 C)-99.5 F (37.5 C)] 99.5 F (37.5 C) (12/02 0557) Pulse Rate:  [65-87] 85 (12/02 0557) Resp:  [12-19] 19 (12/02 0557) BP: (113-137)/(65-69) 135/65 mmHg (12/02 0557) SpO2:  [98 %-99 %] 99 % (12/02 0557)  Physical Exam LLE Medial traumatic wound without drainage or erythema  Tender  No effusion but small traumatic wound ecchymosis of tibial tubercle with intact straight leg raise and no pain with palpation of the patellar tendon  Calc pin sites with continued clear serosanguinous drainage  Sens DPN, SPN, TN intact  Motor EHL, ext, flex, evers 5/5  DP 2+, Edema definitely improved from yesterday but does not yet wrinkle    Assessment/Plan: 3 Days Post-Op Procedure(s) (LRB): EXTERNAL FIXATION LEFT  LEG AND IRRIGATION AND DEBRIDEMENT AND PINNING (Left) 1. Dressings changed again at bedside 2. ICE, elevation, pain control; will try Nucynta and oxy IR plus tramadol 3. ORIF on Monday if soft tissues continue current trajectory of swelling resolution 4. Lovenox 5. Change heel pin site drsgs as needed  I discussed with the patient and his wife the risks and benefits of surgery, including the possibility of infection, nerve injury, vessel injury, wound breakdown, arthritis, symptomatic hardware, DVT/ PE, loss of motion, and need for further surgery among others.  We also specifically discussed the need to stage surgery because of the elevated risk of soft tissue breakdown that could lead to amputation.  He understood these risks and wished to proceed.    Altamese [place], MD [facility] [phone] (p)

## 2015-11-25 NOTE — Progress Notes (Signed)
Pt has had moderate amt of serosanguinous drainage from distal pin site, dressing reinforced and chucks put under leg, Charge RN came to assess and looks ok, will monitor drainage

## 2015-11-25 NOTE — Progress Notes (Signed)
Physical Therapy Treatment Patient Details Name: QUASHAWN FIKES MRN: [id number] DOB: 1956-05-02 Today's Date: 11/25/2015    History of Present Illness Patient s/p fall at home, was approximately 10 feet up on ladder, cleaning gutters, when slipped and fell and suffered open distal tib-fib fracture. Pt s/p Open reduction and internal fixation of grade 2 pilon fracture and external fixation (11/23/15).      PT Comments    Pt working with OT upon entry.  Able to ambulate out of bathroom and into hallway before requesting to sit down due to pain.  HEP issued and reviewed with pt and pt.'s wife. Pt in a great deal of pain but was receiving pain meds as PT left.  Pt reported that he is no longer going home and his surgery has been moved to Monday.  Therefore, he did not need to practice stairs today.  PT will continue to follow.     Follow Up Recommendations  No PT follow up;Supervision - Intermittent     Equipment Recommendations  Rolling walker with 5" wheels;Wheelchair (measurements PT)       Precautions / Restrictions Precautions Precautions: Fall Precaution Comments: external fixator Restrictions Weight Bearing Restrictions: Yes LLE Weight Bearing: Non weight bearing    Mobility  Bed Mobility         Supine to sit: Supervision;HOB elevated     General bed mobility comments: Pt. working with OT upon entry, placed in recliner at end of session.    Transfers Overall transfer level: Needs assistance Equipment used: Rolling walker (2 wheeled) Transfers: Sit to/from Stand Sit to Stand: Min guard         General transfer comment: Min guard for safety and control.  Pt. relies heavily on UE but appears to have good UE strength.    Ambulation/Gait Ambulation/Gait assistance: Min guard Ambulation Distance (Feet): 30 Feet Assistive device: Rolling walker (2 wheeled) Gait Pattern/deviations: Step-to pattern Gait velocity: decreased Gait velocity interpretation: <1.8  ft/sec, indicative of risk for recurrent falls General Gait Details: Pt. able to make it into hallway but reported that his leg was really throbbing.  Pt.'s arms began to fatigue quickly as well.  Recliner follow,       Balance Overall balance assessment: Needs assistance Sitting-balance support: Feet supported Sitting balance-Leahy Scale: Normal     Standing balance support: Bilateral upper extremity supported Standing balance-Leahy Scale: Poor Standing balance comment: RW for support.                    Cognition Arousal/Alertness: Awake/alert Behavior During Therapy: WFL for tasks assessed/performed Overall Cognitive Status: Within Functional Limits for tasks assessed                      Exercises Total Joint Exercises Towel Squeeze: AROM;10 reps;Seated General Exercises - Lower Extremity Ankle Circles/Pumps: AROM;Right;20 reps;Seated Quad Sets: AROM;Left;10 reps;Seated Hip ABduction/ADduction: AAROM;Left;10 reps;Seated Straight Leg Raises: AAROM;Left;10 reps;Seated        Pertinent Vitals/Pain Pain Assessment: 0-10 Pain Score: 9  Faces Pain Scale: Hurts little more Pain Location: L foot Pain Descriptors / Indicators: Aching;Grimacing;Pounding Pain Intervention(s): Limited activity within patient's tolerance;Monitored during session;Repositioned;RN gave pain meds during session           PT Goals (current goals can now be found in the care plan section) Acute Rehab PT Goals Patient Stated Goal: decrease pain Progress towards PT goals: Progressing toward goals    Frequency  Min 5X/week    PT Plan  Current plan remains appropriate       End of Session Equipment Utilized During Treatment: Gait belt Activity Tolerance: Patient limited by pain Patient left: in chair;with call bell/phone within reach;with family/visitor present     Time: [id number] PT Time Calculation (min) (ACUTE ONLY): 22 min  Charges:  1 Gait                    Heywood Footman, [place] - Office   11/25/2015, 3:41 PM

## 2015-11-26 LAB — VITAMIN D 25 HYDROXY (VIT D DEFICIENCY, FRACTURES): Vit D, 25-Hydroxy: 27.6 ng/mL — ABNORMAL LOW (ref 30.0–100.0)

## 2015-11-26 MED ORDER — DOCUSATE SODIUM 100 MG PO CAPS
100.0000 mg | ORAL_CAPSULE | Freq: Two times a day (BID) | ORAL | Status: DC
  Administered 2015-11-26 – 2015-11-30 (×9): 100 mg via ORAL
  Filled 2015-11-26 (×9): qty 1

## 2015-11-26 MED ORDER — POLYETHYLENE GLYCOL 3350 17 G PO PACK
17.0000 g | PACK | Freq: Two times a day (BID) | ORAL | Status: DC
  Administered 2015-11-26 – 2015-11-30 (×6): 17 g via ORAL
  Filled 2015-11-26 (×6): qty 1

## 2015-11-26 MED ORDER — BISACODYL 10 MG RE SUPP: 10.0000 mg | Freq: Every day | RECTAL | Status: DC | PRN

## 2015-11-26 NOTE — Progress Notes (Signed)
Subjective: 4 Days Post-Op Procedure(s) (LRB): EXTERNAL FIXATION LEFT  LEG AND IRRIGATION AND DEBRIDEMENT AND PINNING (Left) Patient reports pain as 4 on 0-10 scale.    Objective: Vital signs in last 24 hours: Temp:  [98.1 F (36.7 C)-98.8 F (37.1 C)] 98.1 F (36.7 C) (12/03 0535) Pulse Rate:  [65-76] 65 (12/03 0535) Resp:  [18-19] 19 (12/03 0535) BP: (117-130)/(55-60) 117/59 mmHg (12/03 0535) SpO2:  [97 %-98 %] 98 % (12/03 0535)  Intake/Output from previous day: 12/02 0701 - 12/03 0700 In: 4057.8 [P.O.:1080; I.V.:2567.8; IV Piggyback:410] Out: 2200 [Urine:2200] Intake/Output this shift:     Recent Labs  11/25/15 0632  HGB 11.7*    Recent Labs  11/25/15 0632  WBC 10.9*  RBC 3.93*  HCT 37.0*  PLT 219    Recent Labs  11/25/15 0632  NA 138  K 3.5  CL 99*  CO2 30  BUN 12  CREATININE 1.20  GLUCOSE 105*  CALCIUM 8.5*   No results for input(s): LABPT, INR in the last 72 hours.  ABD soft Neurovascular intact Sensation intact distally Intact pulses distally Dorsiflexion/Plantar flexion intact Incision: leg dressed pin sites stable. ehl and fhl functioning.  brisk capillary refil  Assessment/Plan: 4 Days Post-Op Procedure(s) (LRB): EXTERNAL FIXATION LEFT  LEG AND IRRIGATION AND DEBRIDEMENT AND PINNING (Left)  Active Problems:   Open ankle fracture  Up with therapy   Plan is to go to the OR on Monday with Dr Thersa Salt J 11/26/2015, 9:37 AM

## 2015-11-27 MED ORDER — BISACODYL 10 MG RE SUPP
10.0000 mg | Freq: Once | RECTAL | Status: DC
  Filled 2015-11-27: qty 1

## 2015-11-28 ENCOUNTER — Inpatient Hospital Stay (HOSPITAL_COMMUNITY): Payer: BLUE CROSS/BLUE SHIELD | Admitting: Certified Registered Nurse Anesthetist

## 2015-11-28 ENCOUNTER — Encounter (HOSPITAL_COMMUNITY)
Admission: EM | Disposition: A | Discharge status: Home / Self Care | Payer: Self-pay | Source: Home / Self Care | Attending: Orthopedic Surgery

## 2015-11-28 ENCOUNTER — Inpatient Hospital Stay (HOSPITAL_COMMUNITY): Payer: BLUE CROSS/BLUE SHIELD

## 2015-11-28 HISTORY — PX: EXTERNAL FIXATION REMOVAL: SHX5040

## 2015-11-28 HISTORY — PX: ORIF TIBIA FRACTURE: SHX5416

## 2015-11-28 LAB — COMPREHENSIVE METABOLIC PANEL
ALT: 91 U/L — ABNORMAL HIGH (ref 17–63)
AST: 56 U/L — ABNORMAL HIGH (ref 15–41)
Albumin: 3.4 g/dL — ABNORMAL LOW (ref 3.5–5.0)
Alkaline Phosphatase: 117 U/L (ref 38–126)
Anion gap: 8 (ref 5–15)
BUN: 16 mg/dL (ref 6–20)
CO2: 25 mmol/L (ref 22–32)
Calcium: 8.8 mg/dL — ABNORMAL LOW (ref 8.9–10.3)
Chloride: 102 mmol/L (ref 101–111)
Creatinine, Ser: 1.2 mg/dL (ref 0.61–1.24)
GFR calc Af Amer: >60 mL/min (ref >60)
GFR calc non Af Amer: >60 mL/min (ref >60)
Glucose, Bld: 114 mg/dL — ABNORMAL HIGH (ref 65–99)
Potassium: 4.1 mmol/L (ref 3.5–5.1)
Sodium: 135 mmol/L (ref 135–145)
Total Bilirubin: 1.1 mg/dL (ref 0.3–1.2)
Total Protein: 6.8 g/dL (ref 6.5–8.1)

## 2015-11-28 LAB — PHOSPHORUS: Phosphorus: 3.1 mg/dL (ref 2.5–4.6)

## 2015-11-28 LAB — TSH: TSH: 1.695 u[IU]/mL (ref 0.350–4.500)

## 2015-11-28 LAB — CBC WITH DIFFERENTIAL/PLATELET
Basophils Absolute: 0 10*3/uL (ref 0.0–0.1)
Basophils Relative: 0 %
Eosinophils Absolute: 0.2 10*3/uL (ref 0.0–0.7)
Eosinophils Relative: 2 %
HCT: 41.3 % (ref 39.0–52.0)
Hemoglobin: 13.7 g/dL (ref 13.0–17.0)
Lymphocytes Relative: 17 %
Lymphs Abs: 2.1 10*3/uL (ref 0.7–4.0)
MCH: 30.2 pg (ref 26.0–34.0)
MCHC: 33.2 g/dL (ref 30.0–36.0)
MCV: 91 fL (ref 78.0–100.0)
Monocytes Absolute: 0.8 10*3/uL (ref 0.1–1.0)
Monocytes Relative: 7 %
Neutro Abs: 9.3 10*3/uL — ABNORMAL HIGH (ref 1.7–7.7)
Neutrophils Relative %: 74 %
Platelets: 276 10*3/uL (ref 150–400)
RBC: 4.54 MIL/uL (ref 4.22–5.81)
RDW: 12.7 % (ref 11.5–15.5)
WBC: 12.4 10*3/uL — ABNORMAL HIGH (ref 4.0–10.5)

## 2015-11-28 LAB — TYPE AND SCREEN
ABO/RH(D): O POS
Antibody Screen: NEGATIVE

## 2015-11-28 LAB — TRANSFERRIN: Transferrin: 243 mg/dL (ref 180–329)

## 2015-11-28 LAB — MAGNESIUM: Magnesium: 2.2 mg/dL (ref 1.7–2.4)

## 2015-11-28 LAB — APTT: aPTT: 29 seconds (ref 24–37)

## 2015-11-28 LAB — SURGICAL PCR SCREEN
MRSA, PCR: NEGATIVE
Staphylococcus aureus: NEGATIVE

## 2015-11-28 LAB — PROTIME-INR
INR: 1.09 (ref 0.00–1.49)
Prothrombin Time: 14.3 seconds (ref 11.6–15.2)

## 2015-11-28 LAB — PREALBUMIN: Prealbumin: 24.7 mg/dL (ref 18–38)

## 2015-11-28 SURGERY — OPEN REDUCTION INTERNAL FIXATION (ORIF) TIBIA FRACTURE
Anesthesia: Regional | Laterality: Left

## 2015-11-28 MED ORDER — METOCLOPRAMIDE HCL 5 MG/ML IJ SOLN: 5.0000 mg | Freq: Three times a day (TID) | INTRAMUSCULAR | Status: DC | PRN

## 2015-11-28 MED ORDER — BUPIVACAINE-EPINEPHRINE (PF) 0.5% -1:200000 IJ SOLN
INTRAMUSCULAR | Status: DC | PRN
  Administered 2015-11-28: 30 mL via PERINEURAL

## 2015-11-28 MED ORDER — PROMETHAZINE HCL 25 MG/ML IJ SOLN: 6.2500 mg | INTRAMUSCULAR | Status: DC | PRN

## 2015-11-28 MED ORDER — HYDROMORPHONE HCL 1 MG/ML IJ SOLN
0.2500 mg | INTRAMUSCULAR | Status: DC | PRN
  Administered 2015-11-28 (×2): 0.5 mg via INTRAVENOUS

## 2015-11-28 MED ORDER — CEFAZOLIN SODIUM-DEXTROSE 2-3 GM-% IV SOLR
2.0000 g | Freq: Once | INTRAVENOUS | Status: AC
  Administered 2015-11-28 (×2): 2 g via INTRAVENOUS
  Filled 2015-11-28: qty 50

## 2015-11-28 MED ORDER — ROCURONIUM BROMIDE 50 MG/5ML IV SOLN
INTRAVENOUS | Status: AC
  Filled 2015-11-28: qty 1

## 2015-11-28 MED ORDER — 0.9 % SODIUM CHLORIDE (POUR BTL) OPTIME
TOPICAL | Status: DC | PRN
  Administered 2015-11-28: 1000 mL

## 2015-11-28 MED ORDER — METOCLOPRAMIDE HCL 5 MG PO TABS: 5.0000 mg | ORAL_TABLET | Freq: Three times a day (TID) | ORAL | Status: DC | PRN

## 2015-11-28 MED ORDER — ACETAMINOPHEN 325 MG PO TABS: 650.0000 mg | ORAL_TABLET | Freq: Four times a day (QID) | ORAL | Status: DC | PRN

## 2015-11-28 MED ORDER — ONDANSETRON HCL 4 MG PO TABS: 4.0000 mg | ORAL_TABLET | Freq: Four times a day (QID) | ORAL | Status: DC | PRN

## 2015-11-28 MED ORDER — MIDAZOLAM HCL 2 MG/2ML IJ SOLN
INTRAMUSCULAR | Status: AC
  Administered 2015-11-28: 2 mg
  Filled 2015-11-28: qty 2

## 2015-11-28 MED ORDER — GLYCOPYRROLATE 0.2 MG/ML IJ SOLN
INTRAMUSCULAR | Status: DC | PRN
  Administered 2015-11-28: .8 mg via INTRAVENOUS

## 2015-11-28 MED ORDER — FENTANYL CITRATE (PF) 100 MCG/2ML IJ SOLN
INTRAMUSCULAR | Status: AC
  Administered 2015-11-28: 50 ug
  Filled 2015-11-28: qty 2

## 2015-11-28 MED ORDER — MIDAZOLAM HCL 2 MG/2ML IJ SOLN
INTRAMUSCULAR | Status: AC
  Filled 2015-11-28: qty 2

## 2015-11-28 MED ORDER — FENTANYL CITRATE (PF) 100 MCG/2ML IJ SOLN
INTRAMUSCULAR | Status: DC | PRN
  Administered 2015-11-28: 100 ug via INTRAVENOUS

## 2015-11-28 MED ORDER — POTASSIUM CHLORIDE IN NACL 20-0.9 MEQ/L-% IV SOLN
INTRAVENOUS | Status: DC
  Administered 2015-11-28: via INTRAVENOUS
  Filled 2015-11-28: qty 1000

## 2015-11-28 MED ORDER — FENTANYL CITRATE (PF) 250 MCG/5ML IJ SOLN
INTRAMUSCULAR | Status: AC
  Filled 2015-11-28: qty 5

## 2015-11-28 MED ORDER — CEFAZOLIN SODIUM 1-5 GM-% IV SOLN
1.0000 g | Freq: Four times a day (QID) | INTRAVENOUS | Status: DC
  Filled 2015-11-28 (×4): qty 50

## 2015-11-28 MED ORDER — LACTATED RINGERS IV SOLN
INTRAVENOUS | Status: DC
  Administered 2015-11-28: 16:00:00 via INTRAVENOUS

## 2015-11-28 MED ORDER — NEOSTIGMINE METHYLSULFATE 10 MG/10ML IV SOLN
INTRAVENOUS | Status: DC | PRN
  Administered 2015-11-28: 5 mg via INTRAVENOUS

## 2015-11-28 MED ORDER — FLUCONAZOLE IN SODIUM CHLORIDE 200-0.9 MG/100ML-% IV SOLN
200.0000 mg | INTRAVENOUS | Status: AC
  Administered 2015-11-28: 200 mg via INTRAVENOUS
  Filled 2015-11-28 (×2): qty 100

## 2015-11-28 MED ORDER — HYDROMORPHONE HCL 1 MG/ML IJ SOLN
INTRAMUSCULAR | Status: AC
  Filled 2015-11-28: qty 1

## 2015-11-28 MED ORDER — PANTOPRAZOLE SODIUM 40 MG PO TBEC
80.0000 mg | DELAYED_RELEASE_TABLET | Freq: Every day | ORAL | Status: DC
  Administered 2015-11-29 – 2015-11-30 (×2): 80 mg via ORAL
  Filled 2015-11-28 (×2): qty 2

## 2015-11-28 MED ORDER — PROPOFOL 10 MG/ML IV BOLUS
INTRAVENOUS | Status: DC | PRN
  Administered 2015-11-28: 200 mg via INTRAVENOUS
  Administered 2015-11-28: 30 mg via INTRAVENOUS

## 2015-11-28 MED ORDER — NEOSTIGMINE METHYLSULFATE 10 MG/10ML IV SOLN
INTRAVENOUS | Status: AC
  Filled 2015-11-28: qty 5

## 2015-11-28 MED ORDER — ACETAMINOPHEN 650 MG RE SUPP: 650.0000 mg | Freq: Four times a day (QID) | RECTAL | Status: DC | PRN

## 2015-11-28 MED ORDER — ONDANSETRON HCL 4 MG/2ML IJ SOLN
4.0000 mg | Freq: Four times a day (QID) | INTRAMUSCULAR | Status: DC | PRN
  Administered 2015-11-28: 4 mg via INTRAVENOUS
  Filled 2015-11-28: qty 2

## 2015-11-28 MED ORDER — GLYCOPYRROLATE 0.2 MG/ML IJ SOLN
INTRAMUSCULAR | Status: AC
  Filled 2015-11-28: qty 4

## 2015-11-28 MED ORDER — ONDANSETRON HCL 4 MG/2ML IJ SOLN
INTRAMUSCULAR | Status: DC | PRN
  Administered 2015-11-28: 4 mg via INTRAVENOUS

## 2015-11-28 MED ORDER — ROCURONIUM BROMIDE 100 MG/10ML IV SOLN
INTRAVENOUS | Status: DC | PRN
  Administered 2015-11-28: 50 mg via INTRAVENOUS
  Administered 2015-11-28: 10 mg via INTRAVENOUS
  Administered 2015-11-28: 20 mg via INTRAVENOUS
  Administered 2015-11-28 (×3): 10 mg via INTRAVENOUS

## 2015-11-28 MED ORDER — LACTATED RINGERS IV SOLN
INTRAVENOUS | Status: DC
  Administered 2015-11-28 (×2): via INTRAVENOUS

## 2015-11-28 SURGICAL SUPPLY — 100 items
BANDAGE ELASTIC 4 VELCRO ST LF (GAUZE/BANDAGES/DRESSINGS) ×2 IMPLANT
BANDAGE ELASTIC 6 VELCRO ST LF (GAUZE/BANDAGES/DRESSINGS) ×2 IMPLANT
BANDAGE ESMARK 6X9 LF (GAUZE/BANDAGES/DRESSINGS) ×1 IMPLANT
BIT DRILL 2.5X2.75 QC CALB (BIT) ×2 IMPLANT
BIT DRILL 3.5X5.5 QC CALB (BIT) ×2 IMPLANT
BIT DRILL CALIBRATED 2.7 (BIT) ×2 IMPLANT
BLADE SURG 10 STRL SS (BLADE) ×2 IMPLANT
BLADE SURG 15 STRL LF DISP TIS (BLADE) ×1 IMPLANT
BLADE SURG 15 STRL SS (BLADE) ×1
BLADE SURG ROTATE 9660 (MISCELLANEOUS) IMPLANT
BNDG COHESIVE 4X5 TAN STRL (GAUZE/BANDAGES/DRESSINGS) ×2 IMPLANT
BNDG ESMARK 6X9 LF (GAUZE/BANDAGES/DRESSINGS) ×2
BNDG GAUZE ELAST 4 BULKY (GAUZE/BANDAGES/DRESSINGS) ×2 IMPLANT
BONE CANC CHIPS 20CC PCAN1/4 (Bone Implant) ×2 IMPLANT
BRUSH SCRUB DISP (MISCELLANEOUS) ×4 IMPLANT
CHIPS CANC BONE 20CC PCAN1/4 (Bone Implant) ×1 IMPLANT
COVER MAYO STAND STRL (DRAPES) ×2 IMPLANT
DRAPE C-ARM 42X72 X-RAY (DRAPES) ×2 IMPLANT
DRAPE C-ARMOR (DRAPES) ×2 IMPLANT
DRAPE INCISE IOBAN 66X45 STRL (DRAPES) ×2 IMPLANT
DRAPE ORTHO SPLIT 77X108 STRL (DRAPES)
DRAPE SURG ORHT 6 SPLT 77X108 (DRAPES) IMPLANT
DRAPE U-SHAPE 47X51 STRL (DRAPES) ×2 IMPLANT
DRSG ADAPTIC 3X8 NADH LF (GAUZE/BANDAGES/DRESSINGS) ×2 IMPLANT
DRSG MEPITEL 4X7.2 (GAUZE/BANDAGES/DRESSINGS) ×2 IMPLANT
DRSG PAD ABDOMINAL 8X10 ST (GAUZE/BANDAGES/DRESSINGS) ×8 IMPLANT
ELECT REM PT RETURN 9FT ADLT (ELECTROSURGICAL) ×2
ELECTRODE REM PT RTRN 9FT ADLT (ELECTROSURGICAL) ×1 IMPLANT
EVACUATOR 3/16  PVC DRAIN (DRAIN)
EVACUATOR 3/16 PVC DRAIN (DRAIN) IMPLANT
GAUZE SPONGE 2X2 8PLY STRL LF (GAUZE/BANDAGES/DRESSINGS) ×1 IMPLANT
GAUZE SPONGE 4X4 12PLY STRL (GAUZE/BANDAGES/DRESSINGS) ×2 IMPLANT
GLOVE BIO SURGEON STRL SZ7.5 (GLOVE) ×2 IMPLANT
GLOVE BIO SURGEON STRL SZ8 (GLOVE) ×2 IMPLANT
GLOVE BIOGEL PI IND STRL 6.5 (GLOVE) ×2 IMPLANT
GLOVE BIOGEL PI IND STRL 7.0 (GLOVE) ×2 IMPLANT
GLOVE BIOGEL PI IND STRL 7.5 (GLOVE) ×1 IMPLANT
GLOVE BIOGEL PI IND STRL 8 (GLOVE) ×1 IMPLANT
GLOVE BIOGEL PI INDICATOR 6.5 (GLOVE) ×2
GLOVE BIOGEL PI INDICATOR 7.0 (GLOVE) ×2
GLOVE BIOGEL PI INDICATOR 7.5 (GLOVE) ×1
GLOVE BIOGEL PI INDICATOR 8 (GLOVE) ×1
GLOVE PROGUARD SZ 7 1/2 (GLOVE) ×2 IMPLANT
GLOVE SURG SS PI 6.5 STRL IVOR (GLOVE) ×2 IMPLANT
GOWN STRL REUS W/ TWL LRG LVL3 (GOWN DISPOSABLE) ×2 IMPLANT
GOWN STRL REUS W/ TWL XL LVL3 (GOWN DISPOSABLE) ×1 IMPLANT
GOWN STRL REUS W/TWL LRG LVL3 (GOWN DISPOSABLE) ×2
GOWN STRL REUS W/TWL XL LVL3 (GOWN DISPOSABLE) ×1
IMMOBILIZER KNEE 22 UNIV (SOFTGOODS) ×2 IMPLANT
KIT BASIN OR (CUSTOM PROCEDURE TRAY) ×2 IMPLANT
KIT ROOM TURNOVER OR (KITS) ×2 IMPLANT
MANIFOLD NEPTUNE II (INSTRUMENTS) ×2 IMPLANT
NDL SUT .5 MAYO 1.404X.05X (NEEDLE) IMPLANT
NDL SUT 6 .5 CRC .975X.05 MAYO (NEEDLE) ×1 IMPLANT
NEEDLE 22X1 1/2 (OR ONLY) (NEEDLE) IMPLANT
NEEDLE MAYO TAPER (NEEDLE) ×1
NS IRRIG 1000ML POUR BTL (IV SOLUTION) ×2 IMPLANT
PACK ORTHO EXTREMITY (CUSTOM PROCEDURE TRAY) ×2 IMPLANT
PAD ARMBOARD 7.5X6 YLW CONV (MISCELLANEOUS) ×4 IMPLANT
PAD CAST 4YDX4 CTTN HI CHSV (CAST SUPPLIES) ×1 IMPLANT
PADDING CAST COTTON 4X4 STRL (CAST SUPPLIES) ×1
PADDING CAST COTTON 6X4 STRL (CAST SUPPLIES) ×2 IMPLANT
PLATE 6H 115 LT DIST ANTLAT TB (Plate) ×2 IMPLANT
PLATE SPIDER 16 (Washer) ×2 IMPLANT
SCREW CORT 3.5X30 815037030 (Screw) ×2 IMPLANT
SCREW CORT 3.5X32 815037032 (Screw) ×2 IMPLANT
SCREW CORTICAL 3.5MM  34MM (Screw) ×1 IMPLANT
SCREW CORTICAL 3.5MM 34MM (Screw) ×1 IMPLANT
SCREW CORTICAL 3.5MM 48MM (Screw) ×2 IMPLANT
SCREW LOCK CORT STAR 3.5X40 (Screw) ×4 IMPLANT
SCREW LOCK CORT STAR 3.5X44 (Screw) ×4 IMPLANT
SCREW LOCK CORT STAR 3.5X46 (Screw) ×4 IMPLANT
SCREW LOW PROFILE 3.5X48MM (Screw) ×2 IMPLANT
SCREW LP 3.5 (Screw) ×2 IMPLANT
SCREW LP 3.5X44 (Screw) ×2 IMPLANT
SLEEVE SURGEON STRL (DRAPES) ×2 IMPLANT
SPLINT PLASTER CAST XFAST 5X30 (CAST SUPPLIES) ×1 IMPLANT
SPLINT PLASTER XFAST SET 5X30 (CAST SUPPLIES) ×1
SPONGE GAUZE 2X2 STER 10/PKG (GAUZE/BANDAGES/DRESSINGS) ×1
SPONGE GAUZE 4X4 12PLY STER LF (GAUZE/BANDAGES/DRESSINGS) ×2 IMPLANT
SPONGE LAP 18X18 X RAY DECT (DISPOSABLE) ×2 IMPLANT
STAPLER VISISTAT 35W (STAPLE) ×2 IMPLANT
STOCKINETTE IMPERVIOUS LG (DRAPES) ×2 IMPLANT
SUCTION FRAZIER TIP 10 FR DISP (SUCTIONS) ×2 IMPLANT
SUT ETHILON 3 0 PS 1 (SUTURE) IMPLANT
SUT PROLENE 0 CT 2 (SUTURE) ×4 IMPLANT
SUT VIC AB 0 CT1 27 (SUTURE) ×1
SUT VIC AB 0 CT1 27XBRD ANBCTR (SUTURE) ×1 IMPLANT
SUT VIC AB 1 CT1 27 (SUTURE) ×1
SUT VIC AB 1 CT1 27XBRD ANBCTR (SUTURE) ×1 IMPLANT
SUT VIC AB 2-0 CT1 27 (SUTURE) ×4
SUT VIC AB 2-0 CT1 TAPERPNT 27 (SUTURE) ×4 IMPLANT
SYR 20ML ECCENTRIC (SYRINGE) IMPLANT
TOWEL OR 17X24 6PK STRL BLUE (TOWEL DISPOSABLE) ×2 IMPLANT
TOWEL OR 17X26 10 PK STRL BLUE (TOWEL DISPOSABLE) ×4 IMPLANT
TRAY FOLEY CATH 16FRSI W/METER (SET/KITS/TRAYS/PACK) IMPLANT
TUBE CONNECTING 12X1/4 (SUCTIONS) ×2 IMPLANT
WATER STERILE IRR 1000ML POUR (IV SOLUTION) ×4 IMPLANT
WIRE K 1.6MM 144256 (MISCELLANEOUS) ×12 IMPLANT
YANKAUER SUCT BULB TIP NO VENT (SUCTIONS) ×2 IMPLANT

## 2015-11-28 NOTE — Progress Notes (Signed)
[facility]edic [facility]uma [facility] Progress Note  Subjective  Doing well  Ready for surgery No new issues  Review of Systems  Constitutional: Negative for fever and chills.  Respiratory: Negative for shortness of breath and wheezing.   Cardiovascular: Negative for chest pain and palpitations.  Gastrointestinal: Negative for nausea and vomiting.  Neurological: Negative for tingling, sensory change and headaches.     Objective   BP 123/72 mmHg  Pulse 76  Temp(Src) 98.5 F (36.9 C) (Oral)  Resp 18  Ht 6' (1.829 m)  Wt 91.581 kg (201 lb 14.4 oz)  BMI 27.38 kg/m2  SpO2 98%  Intake/Output      12/04 0701 - 12/05 0700 12/05 0701 - 12/06 0700   P.O. 580    Total Intake(mL/kg) 580 (6.3)    Urine (mL/kg/hr) 1000 (0.5)    Total Output 1000     Net -420          Urine Occurrence 1 x    Stool Occurrence 1 x      Labs Pending   Exam  Gen: resting comfortably in bed, NAD Lungs: unlabored  Cardiac: RRR Abd: + BS, NTND Ext:       Left Lower Extremity   Ex fix stable  Swelling improved  DPN, SPN, TN sensation intact  EHL, FHL, AT, PT, peroneals, gastroc motor intact  Ext warm   + DP pulse        Imaging    Assessment and Plan   POD/HD#: 61  59 year old white male s/p fall off ladder proximal 5 feet with open left pilon fracture  1. Open comminuted Left pilon fracture s/p ex fix  OR this afternoon for ORIF pilon   NWB  X 8 weeks post op   2. Vitamin D insufficiency   Supplement   Additional labs pending   3. Pain management   nucynta effective   4. Chronic CSF leak  Stable  Low dose lovenox - 30 mg sq daily   5. dispo   OR later this afternoon    Jari Pigg, PA-C [facility] [phone] (O) 11/28/2015 9:04 AM

## 2015-11-28 NOTE — Anesthesia Preprocedure Evaluation (Addendum)
Anesthesia Evaluation  Patient identified by MRN, date of birth, ID band Patient awake    Reviewed: Allergy & Precautions, NPO status , Patient's Chart, lab work & pertinent test results  History of Anesthesia Complications Negative for: history of anesthetic complications  Airway Mallampati: I  TM Distance: >3 FB Neck ROM: Full    Dental  (+) Teeth Intact, Dental Advisory Given   Pulmonary neg pulmonary ROS,    breath sounds clear to auscultation       Cardiovascular hypertension,  Rhythm:Regular Rate:Normal     Neuro/Psych  Headaches, negative psych ROS   GI/Hepatic Neg liver ROS, GERD  Medicated and Controlled,  Endo/Other  negative endocrine ROS  Renal/GU negative Renal ROS     Musculoskeletal   Abdominal (+)  Abdomen: soft. Bowel sounds: normal.  Peds  Hematology negative hematology ROS (+)   Anesthesia Other Findings   Reproductive/Obstetrics                            BP Readings from Last 3 Encounters:  11/28/15 139/79  07/01/15 121/79  08/16/14 116/80   Lab Results  Component Value Date   WBC 12.4* 11/28/2015   HGB 13.7 11/28/2015   HCT 41.3 11/28/2015   MCV 91.0 11/28/2015   PLT 276 11/28/2015     Chemistry      Component Value Date/Time   NA 135 11/28/2015 1003   K 4.1 11/28/2015 1003   CL 102 11/28/2015 1003   CO2 25 11/28/2015 1003   BUN 16 11/28/2015 1003   CREATININE 1.20 11/28/2015 1003      Component Value Date/Time   CALCIUM 8.8* 11/28/2015 1003   ALKPHOS 117 11/28/2015 1003   AST 56* 11/28/2015 1003   ALT 91* 11/28/2015 1003   BILITOT 1.1 11/28/2015 1003       Anesthesia Physical Anesthesia Plan  ASA: III  Anesthesia Plan: General and Regional   Post-op Pain Management: GA combined w/ Regional for post-op pain   Induction: Intravenous  Airway Management Planned: Oral ETT  Additional Equipment:   Intra-op Plan:   Post-operative  Plan: Extubation in OR  Informed Consent: I have reviewed the patients History and Physical, chart, labs and discussed the procedure including the risks, benefits and alternatives for the proposed anesthesia with the patient or authorized representative who has indicated his/her understanding and acceptance.   Dental advisory given  Plan Discussed with: CRNA, Anesthesiologist and Surgeon  Anesthesia Plan Comments:        Anesthesia Quick Evaluation

## 2015-11-28 NOTE — Progress Notes (Signed)
Physical Therapy Treatment Patient Details Name: Randy Bennett MRN: [id number] DOB: [birth year] Today's Date: 11/28/2015    History of Present Illness Patient s/p fall at home, was approximately 10 feet up on ladder, cleaning gutters, when slipped and fell and suffered open distal tib-fib fracture. Pt s/p Open reduction and internal fixation of grade 2 pilon fracture and external fixation (11/23/15).      PT Comments    Pt is progressing well with independence.  Pt able to walk further in hallway and did not require a rest break.  Pt anxious but ready for surgery (planned for 2 pm today - 11-28-15) and is ready to get home and recover.  Will reassess pt after surgery.    Follow Up Recommendations  No PT follow up;Supervision - Intermittent     Equipment Recommendations  Rolling walker with 5" wheels;Wheelchair (measurements PT)       Precautions / Restrictions Precautions Precautions: Fall Precaution Comments: external fixator Restrictions Weight Bearing Restrictions: Yes LLE Weight Bearing: Non weight bearing    Mobility  Bed Mobility Overal bed mobility: Modified Independent Bed Mobility: Supine to Sit     Supine to sit: Modified independent (Device/Increase time)     General bed mobility comments: Pt. able to quickly bring BLE to EOB without assistance or use of bed rails.  HOB slightly elevated.   Transfers Overall transfer level: Needs assistance Equipment used: Rolling walker (2 wheeled) Transfers: Sit to/from Stand Sit to Stand: Supervision         General transfer comment: Supervision for safety as pt has NWB restrictions on LLE.  Pt. able to use good UE strength to stand and is able to quickly gain balance once standing.    Ambulation/Gait Ambulation/Gait assistance: Supervision Ambulation Distance (Feet): 65 Feet Assistive device: Rolling walker (2 wheeled) Gait Pattern/deviations: Step-to pattern     General Gait Details: Pt. able to make it  further into hallway and also able to walk back into room as opposed to riding in recliner.  Pt. does well with NWB LLE and uses walker safely but was reminded to make sure RW was steady before taking a step.  Supervision for safety.       Balance Overall balance assessment: Needs assistance Sitting-balance support: Feet supported Sitting balance-Leahy Scale: Normal     Standing balance support: Bilateral upper extremity supported Standing balance-Leahy Scale: Fair Standing balance comment: RW for support                    Cognition Arousal/Alertness: Awake/alert Behavior During Therapy: WFL for tasks assessed/performed Overall Cognitive Status: Within Functional Limits for tasks assessed                      Exercises Total Joint Exercises Quad Sets: AROM;Left;10 reps;Seated Hip ABduction/ADduction: AROM;Left;10 reps;Seated Straight Leg Raises: AROM;Left;10 reps;Seated        Pertinent Vitals/Pain Pain Assessment: Faces Faces Pain Scale: Hurts even more Pain Location: L ankle/Lower leg Pain Descriptors / Indicators: Grimacing;Sharp Pain Intervention(s): Limited activity within patient's tolerance;Monitored during session;Repositioned           PT Goals (current goals can now be found in the care plan section) Acute Rehab PT Goals Patient Stated Goal: decrease pain and go home Progress towards PT goals: Progressing toward goals    Frequency  Min 5X/week    PT Plan Current plan remains appropriate       End of Session Equipment Utilized During Treatment: Gait belt  Activity Tolerance: Patient limited by pain Patient left: in chair;with call bell/phone within reach     Time: 0928-0940 PT Time Calculation (min) (ACUTE ONLY): 12 min  Charges:   1 Gait                    Heywood Footman, [place] - Office  11/28/2015, 10:39 AM

## 2015-11-28 NOTE — Brief Op Note (Signed)
Dictation# [id number]

## 2015-11-28 NOTE — Transfer of Care (Signed)
Immediate Anesthesia Transfer of Care Note  Patient: Randy Bennett  Procedure(s) Performed: Procedure(s): OPEN REDUCTION INTERNAL FIXATION (ORIF)LEFT  TIBIA  ANKLE FRACTURE (Left) REMOVAL EXTERNAL FIXATION LEG (Left)  Patient Location: PACU  Anesthesia Type:GA combined with regional for post-op pain  Level of Consciousness: awake, alert , oriented and patient cooperative  Airway & Oxygen Therapy: Patient Spontanous Breathing and Patient connected to nasal cannula oxygen  Post-op Assessment: Report given to RN and Post -op Vital signs reviewed and stable  Post vital signs: Reviewed and stable  Last Vitals:  Filed Vitals:   11/28/15 1612 11/28/15 1615  BP:    Pulse: 65 70  Temp:    Resp: 14 15    Complications: No apparent anesthesia complications

## 2015-11-28 NOTE — Anesthesia Procedure Notes (Addendum)
Procedure Name: Intubation Date/Time: 11/28/2015 4:52 PM Performed by: Layla Maw Pre-anesthesia Checklist: Patient identified, Patient being monitored, Timeout performed, Emergency Drugs available and Suction available Patient Re-evaluated:Patient Re-evaluated prior to inductionOxygen Delivery Method: Circle System Utilized Preoxygenation: Pre-oxygenation with 100% oxygen Intubation Type: IV induction Ventilation: Mask ventilation without difficulty Laryngoscope Size: Miller and 3 Grade View: Grade I Tube type: Oral Tube size: 8.0 mm Number of attempts: 1 Airway Equipment and Method: Stylet Placement Confirmation: ETT inserted through vocal cords under direct vision,  positive ETCO2 and breath sounds checked- equal and bilateral Secured at: 23 cm Tube secured with: Tape Dental Injury: Teeth and Oropharynx as per pre-operative assessment    Anesthesia Regional Block:  Popliteal block  Pre-Anesthetic Checklist: ,, timeout performed, Correct Patient, Correct Site, Correct Laterality, Correct Procedure, Correct Position, site marked, Risks and benefits discussed,  Surgical consent,  Pre-op evaluation,  At surgeon's request and post-op pain management  Laterality: Left  Prep: chloraprep       Needles:  Injection technique: Single-shot  Needle Type: Echogenic Stimulator Needle          Additional Needles:  Procedures: ultrasound guided (picture in chart) and nerve stimulator Popliteal block  Nerve Stimulator or Paresthesia:  Response: plantar flexion, 0.45 mA,   Additional Responses:   Narrative:  Start time: 11/28/2015 4:14 PM End time: 11/28/2015 4:24 PM Injection made incrementally with aspirations every 5 mL.  Performed by: Personally  Anesthesiologist: Duane Boston  Additional Notes: A functioning IV was confirmed and monitors were applied.  Sterile prep and drape, hand hygiene and sterile gloves were used.  Negative aspiration and test dose prior  to incremental administration of local anesthetic. The patient tolerated the procedure well.Ultrasound  guidance: relevant anatomy identified, needle position confirmed, local anesthetic spread visualized around nerve(s), vascular puncture avoided.  Image printed for medical record.

## 2015-11-29 ENCOUNTER — Encounter (HOSPITAL_COMMUNITY): Payer: Self-pay | Admitting: Orthopedic Surgery

## 2015-11-29 LAB — BASIC METABOLIC PANEL
Anion gap: 5 (ref 5–15)
BUN: 15 mg/dL (ref 6–20)
CO2: 27 mmol/L (ref 22–32)
Calcium: 8.3 mg/dL — ABNORMAL LOW (ref 8.9–10.3)
Chloride: 102 mmol/L (ref 101–111)
Creatinine, Ser: 1.27 mg/dL — ABNORMAL HIGH (ref 0.61–1.24)
GFR calc Af Amer: >60 mL/min (ref >60)
GFR calc non Af Amer: >60 mL/min (ref >60)
Glucose, Bld: 116 mg/dL — ABNORMAL HIGH (ref 65–99)
Potassium: 4.5 mmol/L (ref 3.5–5.1)
Sodium: 134 mmol/L — ABNORMAL LOW (ref 135–145)

## 2015-11-29 LAB — CBC
HCT: 33.7 % — ABNORMAL LOW (ref 39.0–52.0)
Hemoglobin: 11.5 g/dL — ABNORMAL LOW (ref 13.0–17.0)
MCH: 31 pg (ref 26.0–34.0)
MCHC: 34.1 g/dL (ref 30.0–36.0)
MCV: 90.8 fL (ref 78.0–100.0)
Platelets: 257 10*3/uL (ref 150–400)
RBC: 3.71 MIL/uL — ABNORMAL LOW (ref 4.22–5.81)
RDW: 12.7 % (ref 11.5–15.5)
WBC: 13.2 10*3/uL — ABNORMAL HIGH (ref 4.0–10.5)

## 2015-11-29 LAB — HEMOGLOBIN A1C
Hgb A1c MFr Bld: 5.7 % — ABNORMAL HIGH (ref 4.8–5.6)
Mean Plasma Glucose: 117 mg/dL

## 2015-11-29 LAB — PTH, INTACT AND CALCIUM
Calcium, Total (PTH): 8.7 mg/dL (ref 8.7–10.2)
PTH: 44 pg/mL (ref 15–65)

## 2015-11-29 MED ORDER — CEFAZOLIN SODIUM-DEXTROSE 2-3 GM-% IV SOLR
2.0000 g | Freq: Three times a day (TID) | INTRAVENOUS | Status: AC
  Administered 2015-11-29 (×2): 2 g via INTRAVENOUS
  Filled 2015-11-29 (×2): qty 50

## 2015-11-29 MED ORDER — BETHANECHOL CHLORIDE 25 MG PO TABS
25.0000 mg | ORAL_TABLET | Freq: Four times a day (QID) | ORAL | Status: DC
  Administered 2015-11-29 – 2015-11-30 (×5): 25 mg via ORAL
  Filled 2015-11-29 (×5): qty 1

## 2015-11-29 MED ORDER — HYDROMORPHONE HCL 1 MG/ML IJ SOLN
1.0000 mg | INTRAMUSCULAR | Status: DC | PRN
  Administered 2015-11-29 – 2015-11-30 (×6): 1 mg via INTRAVENOUS
  Filled 2015-11-29 (×6): qty 1

## 2015-11-29 NOTE — Evaluation (Signed)
Physical Therapy Re-Evaluation Patient Details Name: Randy Bennett MRN: [id number] DOB: 15-Nov-1956 Today's Date: 11/29/2015   History of Present Illness  Patient s/p fall at home, was approximately 10 feet up on ladder, cleaning gutters, when slipped and fell and suffered open distal tib-fib fracture. Pt s/p Open reduction and internal fixation of grade 2 pilon fracture and external fixation (11/23/15).  Now s/p ORIF and ex fix removal  Clinical Impression   Patient is s/p above surgery resulting in functional limitations due to the deficits listed below (see PT Problem List). Making very good progress with functional mobility;  Patient will benefit from skilled PT to increase their independence and safety with mobility to allow discharge to the venue listed below.    Goals set on initial PT eval continue to be appropriate     Follow Up Recommendations No PT follow up;Supervision - Intermittent  The potential need for Outpatient PT can be addressed at Ortho follow-up appointments.     Equipment Recommendations  Rolling walker with 5" wheels;Wheelchair (measurements PT)    Recommendations for Other Services       Precautions / Restrictions Precautions Precautions: Fall Restrictions Weight Bearing Restrictions: Yes LLE Weight Bearing: Non weight bearing      Mobility  Bed Mobility Overal bed mobility: Modified Independent Bed Mobility: Supine to Sit     Supine to sit: Modified independent (Device/Increase time)     General bed mobility comments: Pt. able to quickly bring BLE to EOB without assistance or use of bed rails.  HOB slightly elevated.  Transfers Overall transfer level: Needs assistance Equipment used: Rolling walker (2 wheeled) Transfers: Sit to/from Stand Sit to Stand: Min guard (without physical contact)         General transfer comment: Cues for safety and hand placement  Ambulation/Gait Ambulation/Gait assistance: Min guard (without physical  contact) Ambulation Distance (Feet): 100 Feet Assistive device: Rolling walker (2 wheeled) Gait Pattern/deviations: Step-to pattern Gait velocity: decreased   General Gait Details: Good support of body weight on RW   Stairs Stairs: Yes Stairs assistance: Min assist Stair Management: With walker;Backwards;Step to pattern Number of Stairs: 4 General stair comments: verbal and demo cues for sequencing and technique; wife present and gave adeequate assist  Wheelchair Mobility    Modified Rankin (Stroke Patients Only)       Balance Overall balance assessment: Needs assistance Sitting-balance support: No upper extremity supported;Feet supported Sitting balance-Leahy Scale: Normal     Standing balance support: Bilateral upper extremity supported;During functional activity Standing balance-Leahy Scale: Fair Standing balance comment: Rw for support                             Pertinent Vitals/Pain Pain Assessment: 0-10 Pain Score: 7  Pain Location: L lower leg Pain Descriptors / Indicators: Aching;Burning (describes feeling like a hot nail was pushed into his leg) Pain Intervention(s): Monitored during session;Repositioned    Home Living Family/patient expects to be discharged to:: Private residence Living Arrangements: Spouse/significant other;Children Available Help at Discharge: Family;Available 24 hours/day Type of Home: House Home Access: Stairs to enter Entrance Stairs-Rails: None Entrance Stairs-Number of Steps: 2 Home Layout: One level Home Equipment: Crutches Additional Comments: Pt has access to tub transfer bench and 3 in 1.    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     Hand Dominance   Dominant Hand:  Right    Extremity/Trunk Assessment   Upper Extremity Assessment: Overall WFL for tasks assessed           Lower Extremity Assessment: RLE  deficits/detail RLE Deficits / Details: Good quad set and stratight leg raise; active toe wiggle, sensation intact ot light touch    Cervical / Trunk Assessment: Normal  Communication   Communication: No difficulties  Cognition Arousal/Alertness: Awake/alert Behavior During Therapy: WFL for tasks assessed/performed Overall Cognitive Status: Within Functional Limits for tasks assessed                      General Comments      Exercises        Assessment/Plan    PT Assessment Patient needs continued PT services  PT Diagnosis Difficulty walking;Acute pain   PT Problem List Decreased mobility;Decreased knowledge of use of DME;Decreased knowledge of precautions  PT Treatment Interventions Gait training;Stair training;Functional mobility training;Therapeutic activities;Therapeutic exercise;DME instruction;Patient/family education   PT Goals (Current goals can be found in the Care Plan section) Acute Rehab PT Goals Patient Stated Goal: decrease pain and go home PT Goal Formulation: With patient Time For Goal Achievement: 12/08/15 Potential to Achieve Goals: Good    Frequency Min 5X/week   Barriers to discharge        Co-evaluation               End of Session Equipment Utilized During Treatment: Gait belt Activity Tolerance: Patient tolerated treatment well Patient left: in chair;with call bell/phone within reach;with family/visitor present Nurse Communication: Mobility status         Time: 1315-1350 PT Time Calculation (min) (ACUTE ONLY): 35 min   Charges:     PT Treatments $Gait Training: 23-37 mins   PT G Codes:        Quin Hoop 11/29/2015, 2:41 PM  Roney Marion, [place] Pager [phone] Office [phone]

## 2015-11-29 NOTE — Op Note (Signed)
NAMEMarland Kitchen  ARTH, [name] NO.:  [id number]  MEDICAL RECORD NO.:  [id number]  LOCATION:  6N21C                        FACILITY:  [facility]  PHYSICIAN:  Randy Bennett, M.D. DATE OF BIRTH:  [birth year]  DATE OF PROCEDURE:  11/28/2015 DATE OF DISCHARGE:                              OPERATIVE REPORT   PREOPERATIVE DIAGNOSES: 1. Open tibial pilon shaft fracture, grade 2. 2. Retained external fixator. 3. Ulcerated pin sites leg and heel.  POSTOPERATIVE DIAGNOSES: 1. Open tibia shaft fracture, grade 2. 2. Open trimalleolar fracture. 3. Ulcerated pin sites leg and heel.  PROCEDURES: 1. Open reduction and internal fixation of left tibia pilon, tibia     only. 2. Closed manipulation, left fibular fracture. 3. Removal of external fixator under anesthesia. 4. Debridement of ulcerated pin sites, left tibia first metatarsal and     heel pin sites.  SURGEON:  Randy Bennett. Marcelino Bennett, M.D.  ASSISTANT:  Randy Spinner, PA-C.  ANESTHESIA:  General.  COMPLICATIONS:  None.  TOURNIQUET:  None.  DRAINS:  None.  DISPOSITION:  To PACU.  CONDITION:  Stable.  BRIEF SUMMARY AND INDICATION FOR PROCEDURE:  Randy Bennett is a very pleasant, 59 year old male who injured himself in a fall cleaning gutters resulting in a severely displaced left tibial pilon with shaft component and associated trimalleolar fracture pattern.  The patient was placed into an external fixator and debridement performed by Dr. Dorna Bennett. Because of the severity of the fracture, its open nature, and the location, Dr. Berenice Bennett believed this to be outside his scope of practice and asserted that this would be best managed by fellowship trained orthopedic traumatologist.  Consequently, I was consulted for further evaluation and management.  I did discuss with the patient the risk of deep infection that could result in limb loss, nerve injury, vessel injury, DVT, PE, nonunion, malunion, arthritis, loss of motion, and  need for further surgery among others.  We also discussed possible retention of the fixator, nonsurgical versus surgical management, and DVT prophylaxis.  The patient acknowledged these risks and strongly wished to proceed.  BRIEF SUMMARY OF PROCEDURE:  The patient was given preoperative antibiotics consisting of both Ancef as well as fluconazole for a thrush that he had developed.  His left lower extremity was prepped and draped in usual sterile fashion with retention of the fixator and bars along the medial aspect of the foot.  We also left medial malleolar K-wire in place for provisional stabilization. We were able to manipulate the lateral malleolus into an improved alignment.  We then performed an anterolateral exposure going through the anterior compartment and protected the neurovascular bundle both proximally and distally with the EHL tendon.  We were able to mobilize the soft tissues over the bone fragments.  I did keep the periosteum intact to the distal fragments and booked these open to directly evaluate the joint and remove hematoma with lavage and curette.  The components were then teased back into place using a variety of instruments including a dental pick, Freer, and multiple 0.045 and 0.062 K-wires. In so doing, we were able to reconstruct the articular surface.  This also required use of a metaphyseal window created by the fracture through which  I introduced the bone tamp and some cancellous chips to tamp down the articular surface into a reduced position checking it through the more distal anterolateral arthrotomy through a long process involving the sequences, and ultimately we obtained provisional reduction with K-wire fixation.  This was followed by application of a spider plate and screw through the Tillaux fragment.  The anterolateral Biomet plate was then placed over top of this and secured proximally with standard 3.5 screws and distally with anterior to  posterior conical fixation initially and maximally compress the plate to the bone and over drilling the near cortex for compression across the articular surface and subsequently with lock fixation.  Direct evaluation of the joint afterwards revealed excellent reduction, alignment, congruity.  There were no major full-thickness scuffs on the talus with a small scratch down toward the junction of the most distal and central area of the talus with the neck.  Following complete fixation, I then removed the external fixator rather than maintain it as a splint in the postoperative period because of the rather ulcerated pin sites at the tibia, and first met in addition to milder changes at the heel.  The K- wire through the medial malleolus was also removed to reduce the possibility of skin irritation from this pin.  Because I had obtained excellent fixation using the spread of screws so that we began with repair of the anterolateral corner of the tibia, then secondly secured the posterior malleolus, and then lastly the medial malleolus such that all were repaired.  The soft tissue wound was protected from the potential contamination with adjustment of the fixator and removal of the fixator, and then it was closed and then subsequently the pins were removed from the tibia, heel, and metatarsals.  A series of curettes were used to debride the ulcerated and exposed necrotic skin, subcu, fascia, and bone on the near cortex.  They were irrigated thoroughly and then an Adaptic and gauze dressing applied that was removed from the surgical incision.  The patient was awakened from anesthesia and transported to the PACU in stable condition after application of a posterior stirrup splint.  Randy Spinner, PA-C assisted me throughout the procedure and was absolutely necessary to facilitate mobilization and protection of the neurovascular bundle anteriorly during instrumentation of the distal tibia, also to  pull manual traction on the lateral aspect of the calcaneal pin to improve and control reduction to help with some simultaneous closure, and also with debridement of the pins.  He was present throughout.  PROGNOSIS:  The patient will be nonweightbearing on left lower extremity with unrestricted range of motion as soon as the sutures are removed in 2 weeks.  He is certainly at increased risk of nonunion through the tibial shaft component in addition to arthritis at the articular surface because of the intra-articular injury from his trimalleolar fracture.  That being said, we were pleased with the reduction and alignment obtained as well as the fixation.  He has completed a course of antibiotics.  At this time, his wound appears to be in good condition.  He will be on formal pharmacologic DVT prophylaxis with Lovenox.     Astrid Divine. Marcelino Bennett, M.D.     MHH/MEDQ  D:  11/28/2015  T:  11/29/2015  Job:  [id number]

## 2015-11-29 NOTE — Progress Notes (Signed)
[facility]thopaedic Trauma Service Progress Note  Subjective  Doing well this morning Block is still working but does complain of some anterior shin pain Some difficulty voiding last night  Patient has some mild erythema to the volar aspect of his left forearm at the site of an old IV  Tolerating diet  No other complaints  Review of Systems  Constitutional: Negative for fever and chills.  Respiratory: Negative for shortness of breath and wheezing.   Cardiovascular: Negative for chest pain and palpitations.  Gastrointestinal: Negative for nausea, vomiting and abdominal pain.  Genitourinary: Positive for dysuria.  Neurological: Negative for headaches.     Objective   BP 112/64 mmHg  Pulse 83  Temp(Src) 98.8 F (37.1 C) (Oral)  Resp 18  Ht 6' (1.829 m)  Wt 91.581 kg (201 lb 14.4 oz)  BMI 27.38 kg/m2  SpO2 97%  Intake/Output      12/05 0701 - 12/06 0700 12/06 0701 - 12/07 0700   P.O. 0    I.V. (mL/kg) 1400 (15.3)    Total Intake(mL/kg) 1400 (15.3)    Urine (mL/kg/hr) 450 (0.2)    Blood 50 (0)    Total Output 500     Net +900            Labs Results for BRAN, ALDRIDGE (MRN [id number]) as of 11/29/2015 09:47  Ref. Range 11/29/2015 06:40  Sodium Latest Ref Range: 135-145 mmol/L 134 (L)  Potassium Latest Ref Range: 3.5-5.1 mmol/L 4.5  Chloride Latest Ref Range: 101-111 mmol/L 102  CO2 Latest Ref Range: 22-32 mmol/L 27  BUN Latest Ref Range: 6-20 mg/dL 15  Creatinine Latest Ref Range: 0.61-1.24 mg/dL 1.27 (H)  Calcium Latest Ref Range: 8.9-10.3 mg/dL 8.3 (L)  EGFR (Non-African Amer.) Latest Ref Range: >60 mL/min >60  EGFR (African American) Latest Ref Range: >60 mL/min >60  Glucose Latest Ref Range: 65-99 mg/dL 116 (H)  Anion gap Latest Ref Range: 5-15  5  WBC Latest Ref Range: 4.0-10.5 K/uL 13.2 (H)  RBC Latest Ref Range: 4.22-5.81 MIL/uL 3.71 (L)  Hemoglobin Latest Ref Range: 13.0-17.0 g/dL 11.5 (L)  HCT Latest Ref Range: 39.0-52.0 % 33.7 (L)  MCV Latest Ref  Range: 78.0-100.0 fL 90.8  MCH Latest Ref Range: 26.0-34.0 pg 31.0  MCHC Latest Ref Range: 30.0-36.0 g/dL 34.1  RDW Latest Ref Range: 11.5-15.5 % 12.7  Platelets Latest Ref Range: 150-400 K/uL 257    Exam  Gen: Awake and alert, resting comfortably in bed, no acute distress Lungs: Clear auscultation bilaterally Cardiac: Regular rhythm, S1 and S2 Abd: Soft, nontender,+ bowel sounds Ext:       Left arm  Volar Aspect of forearm with mild erythema at old IV site  Area is slightly indurated as well  No purulence  No significant pain       Left lower extremity  Splint is clean dry and intact  Extremity is warm  + DP pulse  No toe motion as block so functional  No DPN or SPN sensation  Faint TN sensation   Swelling is stable      Assessment and Plan   POD/HD#: 4   59 year old white male s/p fall off ladder proximal 5 feet with open left pilon fracture  1. Fall off ladder  2. Open left Pilon fracture, tibia and fibula, s/p ORIF  Nonweightbearing 8 weeks  PT and OT consults  Aggressive ice and elevation  Start mobilizing today  Toe motion is encouraged        Left arm  thrombophlebitis- warm compresses as needed. Pt unable to use tylenol products and NSAIDs contra-indicated due to acute fracture   3. Pain management:              Nucynta 50-100 mg po q6h prn  Oxy IR 5-10 mg po q3h prn breakthrough pain   Ultram 50-100 mg po q6h prn   Dilaudid 1-2 mg IV q2 hr prn severe breakthrough pain   Robaxin 1000 mg po q6h scheduled   4. ABL anemia/Hemodynamics              stable  5. Medical issues               are chronic CSF leak, managed conservatively  6. DVT/PE prophylaxis:             low dose lovenox- 30 mg sq daily x 21 days  Reviewed with pharmacy   7.ID:               Patient on scheduled Ancef for open fracture treatment/surgical prophylaxis and ulcerated pinsite              Continue for another 24 hours   Monitor for thrush   8.Metabolic Bone  Disease:              mild vitamin d insufficiency, other labs acceptable   9 Activity:              Out of bed with assist             Nonweightbearing left leg- crutches or walker  10. FEN/Foley/Lines:              Advanced diet as tolerated  Protonix    Urinary retention    Check U/A   Start urecholine   11.Ex-fix/Splint care:              keep splint clean and dry   12. Dispo:              PT and OT   Ok to get start mobilizing    Jari Pigg, PA-C [facility] [phone] (O) 11/29/2015 9:44 AM

## 2015-11-29 NOTE — Care Management Note (Signed)
Case Management Note  Patient Details  Name: Randy Bennett MRN: [id number] Date of Birth: [birth year]  Subjective/Objective:                    Action/Plan:  UR updated . PT to see again  Expected Discharge Date:                  Expected Discharge Plan:  Home/Self Care  In-House Referral:     Discharge planning Services  CM Consult  Post Acute Care Choice:    Choice offered to:  Patient  DME Arranged:  [place]ford Rile, Wheelchair manual DME Agency:  [place]:    [place] Agency:     Status of Service:  In process, will continue to follow  Medicare Important Message Given:    Date Medicare IM Given:    Medicare IM give by:    Date Additional Medicare IM Given:    Additional Medicare Important Message give by:     If discussed at [place] of Stay Meetings, dates discussed:    Additional Comments:  Marilu Favre, RN 11/29/2015, 9:59 AM

## 2015-11-29 NOTE — Progress Notes (Signed)
Occupational Therapy Treatment Patient Details Name: Randy Bennett MRN: [id number] DOB: 12-04-1956 Today's Date: 11/29/2015    History of present illness Patient s/p fall at home, was approximately 10 feet up on ladder, cleaning gutters, when slipped and fell and suffered open distal tib-fib fracture. Pt s/p Open reduction and internal fixation of grade 2 pilon fracture and external fixation (11/23/15).     OT comments  Patient progressing nicely towards OT goals, exceeding some (therefore upgraded and even added another LTG). No need for a re-evaluation post surgery.    Follow Up Recommendations  No OT follow up    Equipment Recommendations  None recommended by OT    Recommendations for Other Services  None at this time   Precautions / Restrictions Precautions Precautions: Fall Restrictions Weight Bearing Restrictions: Yes LLE Weight Bearing: Non weight bearing    Mobility Bed Mobility Overal bed mobility: Modified Independent Bed Mobility: Supine to Sit     Supine to sit: Modified independent (Device/Increase time)     General bed mobility comments: Pt. able to quickly bring BLE to EOB without assistance or use of bed rails.  HOB slightly elevated.  Transfers Overall transfer level: Needs assistance Equipment used: Rolling walker (2 wheeled) Transfers: Sit to/from Stand Sit to Stand: Min guard General transfer comment: Slight LOB during initial sit to stand. Cues needed for hand placement and overall safety with RW.     Balance Overall balance assessment: Needs assistance Sitting-balance support: No upper extremity supported;Feet supported Sitting balance-Leahy Scale: Normal     Standing balance support: Bilateral upper extremity supported;During functional activity Standing balance-Leahy Scale: Fair Standing balance comment: Rw for support   ADL Overall ADL's : Needs assistance/impaired Eating/Feeding: Independent   Grooming: Set up;Sitting   Upper  Body Bathing: Supervision/ safety;Sitting   Lower Body Bathing: Minimal assistance;Sit to/from stand   Upper Body Dressing : Set up;Sitting   Lower Body Dressing: Minimal assistance;Sit to/from stand   Toilet Transfer: RW;BSC;Ambulation;Min guard   Toileting- Water quality scientist and Hygiene: Min guard;Sit to/from stand       Functional mobility during ADLs: Minimal assistance;Rolling walker;Min guard General ADL Comments: Wife present and with good carryover from last OT session. Wife states that she has purchased a Secondary school teacher and that they have a BSC and tub transfer bench. Wife and patient state they know how to use this DME. Wife with further questions on w/c and she is to bring in w/c on Thursday. Administerred blue theraband for patient to use to incorporate BUE strengthening HEP. Some goals upgraded and one goal added, no need for a re-evaluation.     Cognition   Behavior During Therapy: WFL for tasks assessed/performed Overall Cognitive Status: Within Functional Limits for tasks assessed                 Pertinent Vitals/ Pain       Pain Assessment: 0-10 Pain Score: 5  Pain Location: LLE Pain Descriptors / Indicators: Stabbing;Guarding;Grimacing Pain Intervention(s): Limited activity within patient's tolerance;Monitored during session;Repositioned;Ice applied   Frequency Min 2X/week     Progress Toward Goals  OT Goals(current goals can now befound in the care plan section)  Progress towards OT goals: Progressing toward goals (some goals upgraded/added)  Acute Rehab OT Goals Patient Stated Goal: decrease pain and go home ADL Goals Pt Will Perform Lower Body Bathing: with supervision;sit to/from stand (using AE prn) Pt Will Perform Lower Body Dressing: with supervision;sit to/from stand (using AE prn) Pt Will Transfer to Toilet: with  modified independence;ambulating;bedside commode Pt/caregiver will Perform Home Exercise Program: Increased strength;Both right and  left upper extremity;Independently;With theraband Additional ADL Goal #1: Pt will side step through threshold of bathroom at mod I level  Plan Discharge plan remains appropriate    End of Session Equipment Utilized During Treatment: Rolling walker   Activity Tolerance Patient tolerated treatment well   Patient Left in chair;with call bell/phone within reach;with family/visitor present    Time: 1242-1309 OT Time Calculation (min): 27 min  Charges: OT General Charges $OT Visit: 1 Procedure OT Treatments $Therapeutic Activity: 8-22 mins $Therapeutic Exercise: 8-22 mins  [name] , MS, OTR/L, CLT Pager: [phone]  11/29/2015, 1:25 PM

## 2015-11-30 ENCOUNTER — Encounter (HOSPITAL_COMMUNITY): Payer: Self-pay | Admitting: Orthopedic Surgery

## 2015-11-30 DIAGNOSIS — G9600 Cerebrospinal fluid leak, unspecified: Secondary | ICD-10-CM | POA: Diagnosis present

## 2015-11-30 DIAGNOSIS — K219 Gastro-esophageal reflux disease without esophagitis: Secondary | ICD-10-CM | POA: Diagnosis present

## 2015-11-30 DIAGNOSIS — S82302B Unspecified fracture of lower end of left tibia, initial encounter for open fracture type I or II: Secondary | ICD-10-CM | POA: Diagnosis present

## 2015-11-30 DIAGNOSIS — I10 Essential (primary) hypertension: Secondary | ICD-10-CM | POA: Diagnosis present

## 2015-11-30 DIAGNOSIS — S82852B Displaced trimalleolar fracture of left lower leg, initial encounter for open fracture type I or II: Secondary | ICD-10-CM | POA: Diagnosis present

## 2015-11-30 HISTORY — DX: Displaced trimalleolar fracture of left lower leg, initial encounter for open fracture type I or II: S82.852B

## 2015-11-30 LAB — BASIC METABOLIC PANEL
Anion gap: 6 (ref 5–15)
BUN: 12 mg/dL (ref 6–20)
CO2: 28 mmol/L (ref 22–32)
Calcium: 8.3 mg/dL — ABNORMAL LOW (ref 8.9–10.3)
Chloride: 101 mmol/L (ref 101–111)
Creatinine, Ser: 1.07 mg/dL (ref 0.61–1.24)
GFR calc Af Amer: >60 mL/min (ref >60)
GFR calc non Af Amer: >60 mL/min (ref >60)
Glucose, Bld: 113 mg/dL — ABNORMAL HIGH (ref 65–99)
Potassium: 4.1 mmol/L (ref 3.5–5.1)
Sodium: 135 mmol/L (ref 135–145)

## 2015-11-30 LAB — CBC
HCT: 33.8 % — ABNORMAL LOW (ref 39.0–52.0)
Hemoglobin: 10.8 g/dL — ABNORMAL LOW (ref 13.0–17.0)
MCH: 29.5 pg (ref 26.0–34.0)
MCHC: 32 g/dL (ref 30.0–36.0)
MCV: 92.3 fL (ref 78.0–100.0)
Platelets: 283 10*3/uL (ref 150–400)
RBC: 3.66 MIL/uL — ABNORMAL LOW (ref 4.22–5.81)
RDW: 12.7 % (ref 11.5–15.5)
WBC: 11.7 10*3/uL — ABNORMAL HIGH (ref 4.0–10.5)

## 2015-11-30 MED ORDER — TRAMADOL HCL 50 MG PO TABS: 50.0000 mg | ORAL_TABLET | Freq: Four times a day (QID) | ORAL | Status: DC | PRN

## 2015-11-30 MED ORDER — DOCUSATE SODIUM 100 MG PO CAPS: 100.0000 mg | ORAL_CAPSULE | Freq: Two times a day (BID) | ORAL | Status: DC

## 2015-11-30 MED ORDER — ENOXAPARIN SODIUM 30 MG/0.3ML ~~LOC~~ SOLN: 30.0000 mg | SUBCUTANEOUS | Status: DC

## 2015-11-30 MED ORDER — TAPENTADOL HCL 50 MG PO TABS: 50.0000 mg | ORAL_TABLET | Freq: Four times a day (QID) | ORAL | Status: DC | PRN

## 2015-11-30 MED ORDER — METHOCARBAMOL 500 MG PO TABS: 1000.0000 mg | ORAL_TABLET | Freq: Four times a day (QID) | ORAL | Status: DC | PRN

## 2015-11-30 MED ORDER — OXYCODONE HCL 5 MG PO TABS: 5.0000 mg | ORAL_TABLET | Freq: Four times a day (QID) | ORAL | Status: DC | PRN

## 2015-11-30 NOTE — Discharge Instructions (Signed)
Orthopaedic Trauma Service Discharge Instructions   General Discharge Instructions  WEIGHT BEARING STATUS: Nonweightbearing Left Leg  RANGE OF MOTION/ACTIVITY: No ankle motion at this time. Ok to move toes and knee. Ok to be up and active as long as you maintain nonweightbearing on Left leg   Wound Care: N/A. Keep splint clean and dry   PAIN MEDICATION USE AND EXPECTATIONS  You have likely been given narcotic medications to help control your pain.  After a traumatic event that results in an fracture (broken bone) with or without surgery, it is ok to use narcotic pain medications to help control one's pain.  We understand that everyone responds to pain differently and each individual patient will be evaluated on a regular basis for the continued need for narcotic medications. Ideally, narcotic medication use should last no more than 6-8 weeks (coinciding with fracture healing).   As a patient it is your responsibility as well to monitor narcotic medication use and report the amount and frequency you use these medications when you come to your office visit.   We would also advise that if you are using narcotic medications, you should take a dose prior to therapy to maximize you participation.  IF YOU ARE ON NARCOTIC MEDICATIONS IT IS NOT PERMISSIBLE TO OPERATE A MOTOR VEHICLE (MOTORCYCLE/CAR/TRUCK/MOPED) OR HEAVY MACHINERY DO NOT MIX NARCOTICS WITH OTHER CNS (CENTRAL NERVOUS SYSTEM) DEPRESSANTS SUCH AS ALCOHOL  Diet: as you were eating previously.  Can use over the counter stool softeners and bowel preparations, such as Miralax, to help with bowel movements.  Narcotics can be constipating.  Be sure to drink plenty of fluids    STOP SMOKING OR USING NICOTINE PRODUCTS!!!!  As discussed nicotine severely impairs your body's ability to heal surgical and traumatic wounds but also impairs bone healing.  Wounds and bone heal by forming microscopic blood vessels (angiogenesis) and nicotine is a  vasoconstrictor (essentially, shrinks blood vessels).  Therefore, if vasoconstriction occurs to these microscopic blood vessels they essentially disappear and are unable to deliver necessary nutrients to the healing tissue.  This is one modifiable factor that you can do to dramatically increase your chances of healing your injury.    (This means no smoking, no nicotine gum, patches, etc)  DO NOT USE NONSTEROIDAL ANTI-INFLAMMATORY DRUGS (NSAID'S)  Using products such as Advil (ibuprofen), Aleve (naproxen), Motrin (ibuprofen) for additional pain control during fracture healing can delay and/or prevent the healing response.  If you would like to take over the counter (OTC) medication, Tylenol (acetaminophen) is ok.  However, some narcotic medications that are given for pain control contain acetaminophen as well. Therefore, you should not exceed more than 4000 mg of tylenol in a day if you do not have liver disease.  Also note that there are may OTC medicines, such as cold medicines and allergy medicines that my contain tylenol as well.  If you have any questions about medications and/or interactions please ask your doctor/PA or your pharmacist.      ICE AND ELEVATE INJURED/OPERATIVE EXTREMITY  Using ice and elevating the injured extremity above your heart can help with swelling and pain control.  Icing in a pulsatile fashion, such as 20 minutes on and 20 minutes off, can be followed.    Do not place ice directly on skin. Make sure there is a barrier between to skin and the ice pack.    Using frozen items such as frozen peas works well as the conform nicely to the are that needs to  be iced.  USE AN ACE WRAP OR TED HOSE FOR SWELLING CONTROL  In addition to icing and elevation, Ace wraps or TED hose are used to help limit and resolve swelling.  It is recommended to use Ace wraps or TED hose until you are informed to stop.    When using Ace Wraps start the wrapping distally (farthest away from the body) and  wrap proximally (closer to the body)   Example: If you had surgery on your leg or thing and you do not have a splint on, start the ace wrap at the toes and work your way up to the thigh        If you had surgery on your upper extremity and do not have a splint on, start the ace wrap at your fingers and work your way up to the upper arm  IF YOU ARE IN A SPLINT OR CAST DO NOT [place]   If your splint gets wet for any reason please contact the office immediately. You may shower in your splint or cast as long as you keep it dry.  This can be done by wrapping in a cast cover or garbage back (or similar)  Do Not stick any thing down your splint or cast such as pencils, money, or hangers to try and scratch yourself with.  If you feel itchy take benadryl as prescribed on the bottle for itching  IF YOU ARE IN A CAM BOOT (BLACK BOOT)  You may remove boot periodically. Perform daily dressing changes as noted below.  Wash the liner of the boot regularly and wear a sock when wearing the boot. It is recommended that you sleep in the boot until told otherwise  [place] OR CONCERTS: [id number]

## 2015-11-30 NOTE — Progress Notes (Addendum)
Patient and wife both agreed upon going home today instead of tomorrow. Discussed discharge summary with patient. Reviewed all medications with patient. Patient received Rx. Patient requested not to go home with wheelchair from [place] because they have one from home. Patient ready for discharge.

## 2015-11-30 NOTE — Progress Notes (Signed)
Physical Therapy Treatment Patient Details Name: Randy Bennett MRN: [id number] DOB: 04-18-1956 Today's Date: 11/30/2015    History of Present Illness Patient s/p fall at home, was approximately 10 feet up on ladder, cleaning gutters, when slipped and fell and suffered open distal tib-fib fracture. Pt s/p Open reduction and internal fixation of grade 2 pilon fracture and external fixation (11/23/15).      PT Comments    Pt able to stair train again this session. He was somewhat unsteady with stairs but feels comfortable with them.  He continues to maintain his NWB status well.  Wife not present this session.  HEP reviewed this session.  Pt ready to go home but is unsure if it will be today or tomorrow.  PT will follow acutely as long as pt remains in hospital.   Follow Up Recommendations  No PT follow up;Supervision - Intermittent     Equipment Recommendations  Rolling walker with 5" wheels;Wheelchair (measurements PT)       Precautions / Restrictions Precautions Precautions: Fall Restrictions Weight Bearing Restrictions: Yes LLE Weight Bearing: Non weight bearing    Mobility  Bed Mobility Overal bed mobility: Modified Independent Bed Mobility: Supine to Sit     Supine to sit: Modified independent (Device/Increase time);HOB elevated     General bed mobility comments: Pt. continues to bring BLE to EOB without assistance and showed multiple active SLR during the session.  HOB slightly elevated.   Transfers Overall transfer level: Needs assistance Equipment used: Rolling walker (2 wheeled) Transfers: Sit to/from [facility] Sit to Stand: Supervision Stand pivot transfers: Supervision       General transfer comment: Supervision for safety and balance as pt has NWB restriction.  Cues for hand placement and technique.  Stand pivot x1 to recliner to go practice stairs.    Ambulation/Gait Ambulation/Gait assistance: Supervision Ambulation Distance  (Feet): 175 Feet Assistive device: Rolling walker (2 wheeled) Gait Pattern/deviations: Step-to pattern     General Gait Details: Supervision for safety.  LOB x1 most likely due to UE fatigue.  Cues to relax shoulders.    Stairs Stairs: Yes Stairs assistance: Min assist Stair Management: No rails;Backwards;With walker Number of Stairs: 2 General stair comments: verbal and demo cues for sequencing and technique;  Wife not present this session.  Pt. had trouble bringing walker up to next step by himself and reported that his balance was off today.  Descent was better than ascent.       Balance Overall balance assessment: Needs assistance Sitting-balance support: Feet supported Sitting balance-Leahy Scale: Good     Standing balance support: Bilateral upper extremity supported Standing balance-Leahy Scale: Fair Standing balance comment: RW for support                    Cognition Arousal/Alertness: Awake/alert Behavior During Therapy: WFL for tasks assessed/performed Overall Cognitive Status: Within Functional Limits for tasks assessed                      Exercises Total Joint Exercises Quad Sets: AROM;Left;10 reps;Seated Towel Squeeze: AROM;10 reps;Seated Hip ABduction/ADduction: AROM;Left;10 reps;Seated Straight Leg Raises: AROM;Left;10 reps;Seated        Pertinent Vitals/Pain Pain Assessment: 0-10 Pain Score: 7  Pain Location: L lower leg Pain Descriptors / Indicators: Grimacing;Sore Pain Intervention(s): Limited activity within patient's tolerance;Monitored during session;Repositioned           PT Goals (current goals can now be found in the care plan section) Acute  Rehab PT Goals Patient Stated Goal: to go home Progress towards PT goals: Progressing toward goals    Frequency  Min 5X/week    PT Plan Current plan remains appropriate       End of Session Equipment Utilized During Treatment: Gait belt Activity Tolerance: Patient  tolerated treatment well Patient left: in chair;with call bell/phone within reach     Time: 1025-1047 PT Time Calculation (min) (ACUTE ONLY): 22 min  Charges:   [place], [place] - Office   11/30/2015, 11:23 AM

## 2015-11-30 NOTE — Progress Notes (Signed)
Orthopaedic Trauma Service Progress Note  Subjective  Doing better Block has worn off Mobilizing well No new issues  States oral thrush is improved   Review of Systems  Constitutional: Negative for fever and chills.  Respiratory: Negative for shortness of breath and wheezing.   Cardiovascular: Negative for chest pain and palpitations.  Gastrointestinal: Negative for nausea, vomiting and abdominal pain.  Neurological: Negative for sensory change and headaches.     Objective   BP 121/61 mmHg  Pulse 80  Temp(Src) 98.4 F (36.9 C) (Oral)  Resp 17  Ht 6' (1.829 m)  Wt 91.581 kg (201 lb 14.4 oz)  BMI 27.38 kg/m2  SpO2 96%  Intake/Output      12/06 0701 - 12/07 0700 12/07 0701 - 12/08 0700   P.O. 720 360   I.V. (mL/kg) 40 (0.4)    Total Intake(mL/kg) 760 (8.3) 360 (3.9)   Urine (mL/kg/hr) 2700 (1.2) 800 (2.9)   Blood     Total Output 2700 800   Net -1940 -440          Labs  Results for WYN, NETTLE (MRN [id number]) as of 11/30/2015 10:01  Ref. Range 11/30/2015 05:40  Sodium Latest Ref Range: 135-145 mmol/L 135  Potassium Latest Ref Range: 3.5-5.1 mmol/L 4.1  Chloride Latest Ref Range: 101-111 mmol/L 101  CO2 Latest Ref Range: 22-32 mmol/L 28  BUN Latest Ref Range: 6-20 mg/dL 12  Creatinine Latest Ref Range: 0.61-1.24 mg/dL 1.07  Calcium Latest Ref Range: 8.9-10.3 mg/dL 8.3 (L)  EGFR (Non-African Amer.) Latest Ref Range: >60 mL/min >60  EGFR (African American) Latest Ref Range: >60 mL/min >60  Glucose Latest Ref Range: 65-99 mg/dL 113 (H)  Anion gap Latest Ref Range: 5-15  6  WBC Latest Ref Range: 4.0-10.5 K/uL 11.7 (H)  RBC Latest Ref Range: 4.22-5.81 MIL/uL 3.66 (L)  Hemoglobin Latest Ref Range: 13.0-17.0 g/dL 10.8 (L)  HCT Latest Ref Range: 39.0-52.0 % 33.8 (L)  MCV Latest Ref Range: 78.0-100.0 fL 92.3  MCH Latest Ref Range: 26.0-34.0 pg 29.5  MCHC Latest Ref Range: 30.0-36.0 g/dL 32.0  RDW Latest Ref Range: 11.5-15.5 % 12.7  Platelets Latest Ref  Range: 150-400 K/uL 283    Exam  Gen: Awake and alert, resting comfortably in bed, no acute distress Lungs: Clear auscultation bilaterally Cardiac: Regular rhythm, S1 and S2 Abd: Soft, nontender,+ bowel sounds Ext:        Left lower extremity             Splint is clean dry and intact             Extremity is warm             + DP pulse             No toe motion as block so functional             No DPN or SPN sensation             Faint TN sensation               Swelling is stable    Imaging    Assessment and Plan   POD/HD#: 78  59 year old white male s/p fall off ladder proximal 5 feet with open left pilon fracture  1. Fall off ladder  2. Open left Pilon fracture, tibia and fibula, s/p ORIF             Nonweightbearing 8 weeks  PT and OT             Aggressive ice and elevation             Toe motion is encouraged       Left arm thrombophlebitis- warm compresses as needed. Pt unable to use tylenol products and NSAIDs contra-indicated due to acute fracture   3. Pain management:              Nucynta 50-100 mg po q6h prn             Oxy IR 5-10 mg po q3h prn breakthrough pain               Ultram 50-100 mg po q6h prn               Dilaudid 1-2 mg IV q2 hr prn severe breakthrough pain               Robaxin 1000 mg po q6h scheduled    4. ABL anemia/Hemodynamics              stable  5. Medical issues               are chronic CSF leak, managed conservatively  6. DVT/PE prophylaxis:             low dose lovenox- 30 mg sq daily x 21 days             Reviewed with pharmacy   7.ID:               Patient on scheduled Ancef for open fracture treatment/surgical prophylaxis and ulcerated pinsite              Continue for another 24 hours              Monitor for thrush- improved   8.Metabolic Bone Disease:              mild vitamin d insufficiency, other labs acceptable   9 Activity:              Out of bed with assist             Nonweightbearing  left leg- crutches or walker  10. FEN/Foley/Lines:              Advanced diet as tolerated             Protonix               Urinary retention                           Check U/A- pending   Voided well over course of yesterday                          will dc urecholine   11.Ex-fix/Splint care:              keep splint clean and dry   12. Dispo:              PT and OT               Possible home later today, more likely tomorrow   Jari Pigg, PA-C [facility] [phone] (O) 11/30/2015 9:59 AM

## 2015-11-30 NOTE — Discharge Summary (Signed)
Orthopaedic Trauma Service (OTS)  Patient ID: Randy Bennett MRN: [id number] DOB/AGE: 07-02-1956 59 y.o.  Admit date: 11/22/2015 Discharge date: 12/02/2015  Admission Diagnoses: Open L distal tibia fracture  Open L ankle fracture  Chronic CSF leak HTN GERD  Discharge Diagnoses:  Principal Problem:   Open left trimalleolar fracture Active Problems:   Open fracture of left distal tibia   CSF leak   Hypertension   GERD (gastroesophageal reflux disease)   Vitamin D insufficiency   Procedures Performed:  11/22/2015- Dr. Berenice Primas 1. Open treatment grade 2 pilon fracture. 2. Multiplane External fixation, application for maintenance of length, and     reduction of distal tibial intra-articular fracture. 3. Extension of external fixation pins in the foot to help maintain Achilles length. 4. Interpretation of multiple intraoperative fluoroscopic images. 5. Removal of skin, subcutaneous tissue, muscle fascia, and bone from     the site of an open fractures.  11/28/2015- Dr. Marcelino Scot 1. Open reduction and internal fixation of left tibia pilon, tibia     only. 2. Closed manipulation, left fibular fracture. 3. Removal of external fixator under anesthesia. 4. Debridement of ulcerated pin sites, left tibia first metatarsal and     heel pin sites.   Discharged Condition: good  Hospital Course:   Mr. Randy Bennett is a 59 year old white male who was injured on 11/22/2015 when he fell off of a ladder while cleaning a gutter. Patient sustained an open fracture o his left distal tibia and fibula. Patient was brought to [place]. He was admitted to orthopedics. He was taken emergently to the OR for irrigation and debridement of his open wound as well as placement of a spanning external fixator. Orthopedic trauma service was consult and regarding his injury. Patient was seen by our service on 11/24/2015. Initially patient had a tremendous amount of swelling and we thought we would have  to delay definitive fixation several weeks. However patient was placed in to another bulky compressive wrap and aggressive ice and elevation were performed. The following day he had tremendous responsiveness and his soft tissue much improved including his traumatic wound. As such we felt that we would be able to treat him definitively during this hospitalization. Patient does have a fairly complex medical history with a chronic CSF leak. Currently being managed without invasive procedures as patient has had numerous patches that have gone on to fail. With this patient is somewhat intolerant to several medications. He is on Dilaudid chronically for headaches and this has minimal at this point. He is unable to tolerate Tylenol products as Tylenol causes headache. With this we tried a myriad of treatment approaches. Initially we started off with OxyIR but the higher doses above 15 mg every 646 hours was made him too somnolent. We did discuss additional options and we have decided to trial Nucynta along with Ultram. Patient had excellent response to the Nucynta as this was adequately controlling his pain. Patient mobilized well with therapy. On 11/28/2015 patient was taken back to the operating room for definitive fixation of his left distal tibia  After surgery patient was taken back to the PACU for recovery from anesthesia and transferred back to [place] for observation, pain control and therapies. The remainder of his hospital stay was uncomplicated. Patient progressed very quickly with therapy postoperative day #2 he was deemed to be stable for discharge. At the time of discharge patient was tolerating a regular diet and voiding without difficulty.  Laboratory workup didn't demonstrate  very mild vitamin D insufficiency. Patient was started on supplementation for this.  Given his chronic CSF leak did discuss with pharmacy about most appropriate anticoagulant to use with his situation. We felt that Lovenox 30 mg  subcutaneously daily for 21 days as well as sufficient safe for prophylaxis  Consults: None  Significant Diagnostic Studies: labs:  Results for MANDO, BLATZ (MRN [id number]) as of 11/30/2015 10:01  Ref. Range 11/25/2015 06:32  Vit D, 25-Hydroxy Latest Ref Range: 30.0-100.0 ng/mL 27.6 (L)   Results for SALIL, RAINERI (MRN [id number]) as of 11/30/2015 10:01  Ref. Range 11/28/2015 10:03  Sodium Latest Ref Range: 135-145 mmol/L 135  Potassium Latest Ref Range: 3.5-5.1 mmol/L 4.1  Chloride Latest Ref Range: 101-111 mmol/L 102  CO2 Latest Ref Range: 22-32 mmol/L 25  Mean Plasma Glucose Latest Units: mg/dL 117  BUN Latest Ref Range: 6-20 mg/dL 16  Creatinine Latest Ref Range: 0.61-1.24 mg/dL 1.20  Calcium Latest Ref Range: 8.9-10.3 mg/dL 8.8 (L)  EGFR (Non-African Amer.) Latest Ref Range: >60 mL/min >60  EGFR (African American) Latest Ref Range: >60 mL/min >60  Glucose Latest Ref Range: 65-99 mg/dL 114 (H)  Anion gap Latest Ref Range: 5-15  8  Calcium, Total (PTH) Latest Ref Range: 8.7-10.2 mg/dL 8.7  Phosphorus Latest Ref Range: 2.5-4.6 mg/dL 3.1  Magnesium Latest Ref Range: 1.7-2.4 mg/dL 2.2  Alkaline Phosphatase Latest Ref Range: 38-126 U/L 117  Albumin Latest Ref Range: 3.5-5.0 g/dL 3.4 (L)  AST Latest Ref Range: 15-41 U/L 56 (H)  ALT Latest Ref Range: 17-63 U/L 91 (H)  Total Protein Latest Ref Range: 6.5-8.1 g/dL 6.8  Total Bilirubin Latest Ref Range: 0.3-1.2 mg/dL 1.1  PREALBUMIN Latest Ref Range: 18-38 mg/dL 24.7  Transferrin Latest Ref Range: 180-329 mg/dL 243  WBC Latest Ref Range: 4.0-10.5 K/uL 12.4 (H)  RBC Latest Ref Range: 4.22-5.81 MIL/uL 4.54  Hemoglobin Latest Ref Range: 13.0-17.0 g/dL 13.7  HCT Latest Ref Range: 39.0-52.0 % 41.3  MCV Latest Ref Range: 78.0-100.0 fL 91.0  MCH Latest Ref Range: 26.0-34.0 pg 30.2  MCHC Latest Ref Range: 30.0-36.0 g/dL 33.2  RDW Latest Ref Range: 11.5-15.5 % 12.7  Platelets Latest Ref Range: 150-400 K/uL 276  Neutrophils Latest  Units: % 74  Lymphocytes Latest Units: % 17  Monocytes Relative Latest Units: % 7  Eosinophil Latest Units: % 2  Basophil Latest Units: % 0  NEUT# Latest Ref Range: 1.7-7.7 K/uL 9.3 (H)  Lymphocyte # Latest Ref Range: 0.7-4.0 K/uL 2.1  Monocyte # Latest Ref Range: 0.1-1.0 K/uL 0.8  Eosinophils Absolute Latest Ref Range: 0.0-0.7 K/uL 0.2  Basophils Absolute Latest Ref Range: 0.0-0.1 K/uL 0.0  Prothrombin Time Latest Ref Range: 11.6-15.2 seconds 14.3  INR Latest Ref Range: 0.00-1.49  1.09  APTT Latest Ref Range: 24-37 seconds 29  Hemoglobin A1C Latest Ref Range: 4.8-5.6 % 5.7 (H)  PTH Latest Ref Range: 15-65 pg/mL 44  TSH Latest Ref Range: 0.350-4.500 uIU/mL 1.695   Results for DESTAN, FRANCHINI (MRN [id number]) as of 11/30/2015 10:01  Ref. Range 11/30/2015 05:40  WBC Latest Ref Range: 4.0-10.5 K/uL 11.7 (H)  RBC Latest Ref Range: 4.22-5.81 MIL/uL 3.66 (L)  Hemoglobin Latest Ref Range: 13.0-17.0 g/dL 10.8 (L)  HCT Latest Ref Range: 39.0-52.0 % 33.8 (L)  MCV Latest Ref Range: 78.0-100.0 fL 92.3  MCH Latest Ref Range: 26.0-34.0 pg 29.5  MCHC Latest Ref Range: 30.0-36.0 g/dL 32.0  RDW Latest Ref Range: 11.5-15.5 % 12.7  Platelets Latest Ref Range: 150-400 K/uL 283  Treatments: IV hydration, antibiotics: Ancef, analgesia: dilaudid, Oxy IR, Nucynta, ultram, anticoagulation: LMW heparin, therapies: PT, OT and RN and surgery: as above   Discharge Exam:   Orthopaedic Trauma Service Progress Note  Subjective  Doing better Block has worn off Mobilizing well No new issues   States oral thrush is improved   Review of Systems  Constitutional: Negative for fever and chills.  Respiratory: Negative for shortness of breath and wheezing.   Cardiovascular: Negative for chest pain and palpitations.  Gastrointestinal: Negative for nausea, vomiting and abdominal pain.  Neurological: Negative for sensory change and headaches.     Objective   BP 121/61 mmHg  Pulse 80  Temp(Src) 98.4  F (36.9 C) (Oral)  Resp 17  Ht 6' (1.829 m)  Wt 91.581 kg (201 lb 14.4 oz)  BMI 27.38 kg/m2  SpO2 96%  Intake/Output       12/06 0701 - 12/07 0700 12/07 0701 - 12/08 0700    P.O. 720 360    I.V. (mL/kg) 40 (0.4)     Total Intake(mL/kg) 760 (8.3) 360 (3.9)    Urine (mL/kg/hr) 2700 (1.2) 800 (2.9)    Blood      Total Output 2700 800    Net -1940 -440            Labs  Results for MARKANTHONY, GEDNEY (MRN [id number]) as of 11/30/2015 10:01   Ref. Range  11/30/2015 05:40   Sodium  Latest Ref Range: 135-145 mmol/L  135   Potassium  Latest Ref Range: 3.5-5.1 mmol/L  4.1   Chloride  Latest Ref Range: 101-111 mmol/L  101   CO2  Latest Ref Range: 22-32 mmol/L  28   BUN  Latest Ref Range: 6-20 mg/dL  12   Creatinine  Latest Ref Range: 0.61-1.24 mg/dL  1.07   Calcium  Latest Ref Range: 8.9-10.3 mg/dL  8.3 (L)   EGFR (Non-African Amer.)  Latest Ref Range: >60 mL/min  >60   EGFR (African American)  Latest Ref Range: >60 mL/min  >60   Glucose  Latest Ref Range: 65-99 mg/dL  113 (H)   Anion gap  Latest Ref Range: 5-15   6   WBC  Latest Ref Range: 4.0-10.5 K/uL  11.7 (H)   RBC  Latest Ref Range: 4.22-5.81 MIL/uL  3.66 (L)   Hemoglobin  Latest Ref Range: 13.0-17.0 g/dL  10.8 (L)   HCT  Latest Ref Range: 39.0-52.0 %  33.8 (L)   MCV  Latest Ref Range: 78.0-100.0 fL  92.3   MCH  Latest Ref Range: 26.0-34.0 pg  29.5   MCHC  Latest Ref Range: 30.0-36.0 g/dL  32.0   RDW  Latest Ref Range: 11.5-15.5 %  12.7   Platelets  Latest Ref Range: 150-400 K/uL  283     Exam  Gen: Awake and alert, resting comfortably in bed, no acute distress Lungs: Clear auscultation bilaterally Cardiac: Regular rhythm, S1 and S2 Abd: Soft, nontender,+ bowel sounds Ext:        Left lower extremity             Splint is clean dry and intact             Extremity is warm             + DP pulse             No toe motion as block so functional             No  DPN or SPN sensation             Faint TN sensation                Swelling is stable    Imaging    Assessment and Plan    POD/HD#: 106  59 year old white male s/p fall off ladder proximal 5 feet with open left pilon fracture  1. Fall off ladder  2. Open left Pilon fracture, tibia and fibula, s/p ORIF             Nonweightbearing 8 weeks             PT and OT             Aggressive ice and elevation             Toe motion is encouraged       Left arm thrombophlebitis- warm compresses as needed. Pt unable to use tylenol products and NSAIDs contra-indicated due to acute fracture   3. Pain management:              Nucynta 50-100 mg po q6h prn             Oxy IR 5-10 mg po q3h prn breakthrough pain               Ultram 50-100 mg po q6h prn               Dilaudid 1-2 mg IV q2 hr prn severe breakthrough pain               Robaxin 1000 mg po q6h scheduled    4. ABL anemia/Hemodynamics              stable  5. Medical issues               are chronic CSF leak, managed conservatively  6. DVT/PE prophylaxis:             low dose lovenox- 30 mg sq daily x 21 days             Reviewed with pharmacy   7.ID:                Monitor for thrush- improved   8.Metabolic Bone Disease:              mild vitamin d insufficiency, other labs acceptable   9 Activity:              Out of bed with assist             Nonweightbearing left leg- crutches or walker  10. FEN/Foley/Lines:              Advanced diet as tolerated             Protonix   11.Ex-fix/Splint care:              keep splint clean and dry   12. Dispo:              PT and OT     Disposition: 01-Home or Self Care      Discharge Instructions    Call MD / Call 911    Complete by:  As directed   If you experience chest pain or shortness of breath, CALL 911 and be transported to the hospital emergency room.  If you develope a fever above 101 F, pus (white drainage) or increased drainage or redness at the  wound, or calf pain, call your surgeon's office.     Constipation  Prevention    Complete by:  As directed   Drink plenty of fluids.  Prune juice may be helpful.  You may use a stool softener, such as Colace (over the counter) 100 mg twice a day.  Use MiraLax (over the counter) for constipation as needed.     Diet - low sodium heart healthy    Complete by:  As directed      Discharge instructions    Complete by:  As directed   Orthopaedic Trauma Service Discharge Instructions   General Discharge Instructions  WEIGHT BEARING STATUS: Nonweightbearing Left Leg  RANGE OF MOTION/ACTIVITY: No ankle motion at this time. Ok to move toes and knee. Ok to be up and active as long as you maintain nonweightbearing on Left leg   Wound Care: N/A. Keep splint clean and dry   PAIN MEDICATION USE AND EXPECTATIONS  You have likely been given narcotic medications to help control your pain.  After a traumatic event that results in an fracture (broken bone) with or without surgery, it is ok to use narcotic pain medications to help control one's pain.  We understand that everyone responds to pain differently and each individual patient will be evaluated on a regular basis for the continued need for narcotic medications. Ideally, narcotic medication use should last no more than 6-8 weeks (coinciding with fracture healing).   As a patient it is your responsibility as well to monitor narcotic medication use and report the amount and frequency you use these medications when you come to your office visit.   We would also advise that if you are using narcotic medications, you should take a dose prior to therapy to maximize you participation.  IF YOU ARE ON NARCOTIC MEDICATIONS IT IS NOT PERMISSIBLE TO OPERATE A MOTOR VEHICLE (MOTORCYCLE/CAR/TRUCK/MOPED) OR HEAVY MACHINERY DO NOT MIX NARCOTICS WITH OTHER CNS (CENTRAL NERVOUS SYSTEM) DEPRESSANTS SUCH AS ALCOHOL  Diet: as you were eating previously.  Can use over the counter stool softeners and bowel preparations, such as Miralax, to help  with bowel movements.  Narcotics can be constipating.  Be sure to drink plenty of fluids    STOP SMOKING OR USING NICOTINE PRODUCTS!!!!  As discussed nicotine severely impairs your body's ability to heal surgical and traumatic wounds but also impairs bone healing.  Wounds and bone heal by forming microscopic blood vessels (angiogenesis) and nicotine is a vasoconstrictor (essentially, shrinks blood vessels).  Therefore, if vasoconstriction occurs to these microscopic blood vessels they essentially disappear and are unable to deliver necessary nutrients to the healing tissue.  This is one modifiable factor that you can do to dramatically increase your chances of healing your injury.    (This means no smoking, no nicotine gum, patches, etc)  DO NOT USE NONSTEROIDAL ANTI-INFLAMMATORY DRUGS (NSAID'S)  Using products such as Advil (ibuprofen), Aleve (naproxen), Motrin (ibuprofen) for additional pain control during fracture healing can delay and/or prevent the healing response.  If you would like to take over the counter (OTC) medication, Tylenol (acetaminophen) is ok.  However, some narcotic medications that are given for pain control contain acetaminophen as well. Therefore, you should not exceed more than 4000 mg of tylenol in a day if you do not have liver disease.  Also note that there are may OTC medicines, such as cold medicines and allergy medicines that my contain tylenol as well.  If you have any questions about medications and/or interactions  please ask your doctor/PA or your pharmacist.      ICE AND ELEVATE INJURED/OPERATIVE EXTREMITY  Using ice and elevating the injured extremity above your heart can help with swelling and pain control.  Icing in a pulsatile fashion, such as 20 minutes on and 20 minutes off, can be followed.    Do not place ice directly on skin. Make sure there is a barrier between to skin and the ice pack.    Using frozen items such as frozen peas works well as the conform  nicely to the are that needs to be iced.  USE AN ACE WRAP OR TED HOSE FOR SWELLING CONTROL  In addition to icing and elevation, Ace wraps or TED hose are used to help limit and resolve swelling.  It is recommended to use Ace wraps or TED hose until you are informed to stop.    When using Ace Wraps start the wrapping distally (farthest away from the body) and wrap proximally (closer to the body)   Example: If you had surgery on your leg or thing and you do not have a splint on, start the ace wrap at the toes and work your way up to the thigh        If you had surgery on your upper extremity and do not have a splint on, start the ace wrap at your fingers and work your way up to the upper arm  IF YOU ARE IN A SPLINT OR CAST DO NOT [place]   If your splint gets wet for any reason please contact the office immediately. You may shower in your splint or cast as long as you keep it dry.  This can be done by wrapping in a cast cover or garbage back (or similar)  Do Not stick any thing down your splint or cast such as pencils, money, or hangers to try and scratch yourself with.  If you feel itchy take benadryl as prescribed on the bottle for itching  IF YOU ARE IN A CAM BOOT (BLACK BOOT)  You may remove boot periodically. Perform daily dressing changes as noted below.  Wash the liner of the boot regularly and wear a sock when wearing the boot. It is recommended that you sleep in the boot until told otherwise  CALL THE OFFICE WITH ANY QUESTIONS OR CONCERTS: [phone]     Driving restrictions    Complete by:  As directed   No driving     Increase activity slowly as tolerated    Complete by:  As directed      Non weight bearing    Complete by:  As directed   Laterality:  left  Extremity:  Lower            Medication List    STOP taking these medications        ibuprofen 800 MG tablet  Commonly known as:  ADVIL,MOTRIN     ondansetron 4 MG tablet  Commonly known as:  ZOFRAN       TAKE these medications        atorvastatin 20 MG tablet  Commonly known as:  LIPITOR  Take 20 mg by mouth daily at 6 PM.     cetirizine 10 MG tablet  Commonly known as:  ZYRTEC  Take 10 mg by mouth daily.     diazepam 5 MG tablet  Commonly known as:  VALIUM  Take 5 mg by mouth every 12 (twelve) hours as needed for  anxiety.     docusate sodium 100 MG capsule  Commonly known as:  COLACE  Take 1 capsule (100 mg total) by mouth 2 (two) times daily.     enoxaparin 30 MG/0.3ML injection  Commonly known as:  LOVENOX  Inject 0.3 mLs (30 mg total) into the skin daily.     HYDROmorphone 2 MG tablet  Commonly known as:  DILAUDID  Take 2 mg by mouth every 4 (four) hours as needed for severe pain.     methocarbamol 500 MG tablet  Commonly known as:  ROBAXIN  Take 2 tablets (1,000 mg total) by mouth every 6 (six) hours as needed for muscle spasms.     omeprazole 40 MG capsule  Commonly known as:  PRILOSEC  Take 40 mg by mouth daily as needed (heartburn).     oxyCODONE 5 MG immediate release tablet  Commonly known as:  Oxy IR/ROXICODONE  Take 1-2 tablets (5-10 mg total) by mouth every 6 (six) hours as needed for breakthrough pain (take between nucynta doses for breakthrough pain).     tapentadol 50 MG Tabs tablet  Commonly known as:  NUCYNTA  Take 1-2 tablets (50-100 mg total) by mouth every 6 (six) hours as needed for moderate pain or severe pain.     traMADol 50 MG tablet  Commonly known as:  ULTRAM  Take 1-2 tablets (50-100 mg total) by mouth every 6 (six) hours as needed for moderate pain.       Follow-up Information    Follow up with HANDY,Arran H, MD. Schedule an appointment as soon as possible for a visit in 2 weeks.   Specialty:  Orthopedic Surgery   Why:  For wound re-check, For suture removal   Contact information:   [place] [address] [place] [address] [phone]       Discharge Instructions and Plan:  59 year old white male s/p fall  off ladder proximal 5 feet with open left pilon fracture  1. Fall off ladder  2. Open left Pilon fracture, tibia and fibula, s/p ORIF             Nonweightbearing 8 weeks             PT and OT             Aggressive ice and elevation             Toe motion is encouraged       Left arm thrombophlebitis- warm compresses as needed. Pt unable to use tylenol products and NSAIDs contra-indicated due to acute fracture   3. Pain management:              Nucynta 50-100 mg po q6h prn             Oxy IR 5-10 mg po q3h prn breakthrough pain               Ultram 50-100 mg po q6h prn               Dilaudid 1-2 mg IV q2 hr prn severe breakthrough pain               Robaxin 1000 mg po q6h scheduled    4. ABL anemia/Hemodynamics              stable  5. Medical issues               are chronic CSF leak, managed conservatively  6. DVT/PE prophylaxis:  low dose lovenox- 30 mg sq daily x 21 days             Reviewed with pharmacy   7.ID:               Monitor for thrush- improved   8.Metabolic Bone Disease:              mild vitamin d insufficiency, other labs acceptable   9 Activity:              Out of bed with assist             Nonweightbearing left leg- crutches or walker  10. FEN/Foley/Lines:              Advanced diet as tolerated             Protonix   11.Ex-fix/Splint care:              keep splint clean and dry   12. Dispo:              PT and OT  HEP     Follow up with ortho in 10-14 days   Signed:  Jari Pigg, PA-C [facility] [phone] (P) 12/02/2015, 10:19 AM

## 2015-12-02 ENCOUNTER — Encounter (HOSPITAL_COMMUNITY): Payer: Self-pay | Admitting: Orthopedic Surgery

## 2015-12-02 DIAGNOSIS — E559 Vitamin D deficiency, unspecified: Secondary | ICD-10-CM

## 2015-12-02 HISTORY — DX: Vitamin D deficiency, unspecified: E55.9

## 2015-12-08 NOTE — Anesthesia Postprocedure Evaluation (Signed)
Anesthesia Post Note  Patient: Randy Bennett  Procedure(s) Performed: Procedure(s) (LRB): OPEN REDUCTION INTERNAL FIXATION (ORIF)LEFT  TIBIA  ANKLE FRACTURE (Left) REMOVAL EXTERNAL FIXATION LEG (Left)  Patient location during evaluation: PACU Anesthesia Type: General Level of consciousness: sedated and patient cooperative Pain management: pain level controlled Vital Signs Assessment: post-procedure vital signs reviewed and stable Respiratory status: spontaneous breathing Cardiovascular status: stable Anesthetic complications: no    Last Vitals:  Filed Vitals:   11/30/15 0630 11/30/15 1349  BP: 121/61 121/64  Pulse: 80 83  Temp: 36.9 C 36.8 C  Resp: 17 18    Last Pain:  Filed Vitals:   11/30/15 1637  PainSc: [place]

## 2016-02-28 ENCOUNTER — Other Ambulatory Visit: Payer: Self-pay | Admitting: Family Medicine

## 2016-02-28 DIAGNOSIS — G9001 Carotid sinus syncope: Secondary | ICD-10-CM

## 2016-02-28 DIAGNOSIS — R51 Headache: Principal | ICD-10-CM

## 2016-02-28 DIAGNOSIS — R519 Headache, unspecified: Secondary | ICD-10-CM

## 2016-03-15 ENCOUNTER — Other Ambulatory Visit: Payer: Self-pay | Admitting: Orthopedic Surgery

## 2016-03-15 DIAGNOSIS — M25572 Pain in left ankle and joints of left foot: Secondary | ICD-10-CM

## 2016-03-19 ENCOUNTER — Ambulatory Visit
Admission: RE | Admit: 2016-03-19 | Discharge: 2016-03-19 | Disposition: A | Discharge status: Home / Self Care | Payer: BLUE CROSS/BLUE SHIELD | Source: Ambulatory Visit | Attending: Family Medicine | Admitting: Family Medicine

## 2016-03-19 DIAGNOSIS — G9001 Carotid sinus syncope: Secondary | ICD-10-CM

## 2016-03-19 DIAGNOSIS — G960 Cerebrospinal fluid leak: Secondary | ICD-10-CM | POA: Insufficient documentation

## 2016-03-19 DIAGNOSIS — I679 Cerebrovascular disease, unspecified: Secondary | ICD-10-CM | POA: Insufficient documentation

## 2016-03-19 DIAGNOSIS — R9082 White matter disease, unspecified: Secondary | ICD-10-CM | POA: Diagnosis not present

## 2016-03-19 DIAGNOSIS — S0990XS Unspecified injury of head, sequela: Secondary | ICD-10-CM | POA: Diagnosis not present

## 2016-03-19 DIAGNOSIS — R51 Headache: Secondary | ICD-10-CM | POA: Insufficient documentation

## 2016-03-19 DIAGNOSIS — R519 Headache, unspecified: Secondary | ICD-10-CM

## 2016-03-19 MED ORDER — GADOBENATE DIMEGLUMINE 529 MG/ML IV SOLN
20.0000 mL | Freq: Once | INTRAVENOUS | Status: AC | PRN
  Administered 2016-03-19: 19 mL via INTRAVENOUS

## 2016-04-05 ENCOUNTER — Ambulatory Visit
Admission: RE | Admit: 2016-04-05 | Discharge: 2016-04-05 | Disposition: A | Discharge status: Home / Self Care | Payer: BLUE CROSS/BLUE SHIELD | Source: Ambulatory Visit | Attending: Orthopedic Surgery | Admitting: Orthopedic Surgery

## 2016-04-05 DIAGNOSIS — M25572 Pain in left ankle and joints of left foot: Secondary | ICD-10-CM

## 2016-04-07 ENCOUNTER — Other Ambulatory Visit: Payer: Self-pay | Admitting: Physician Assistant

## 2016-04-30 ENCOUNTER — Encounter (HOSPITAL_COMMUNITY): Payer: Self-pay | Admitting: *Deleted

## 2016-04-30 NOTE — Progress Notes (Signed)
MR Jimmye Norman' PCP is Dr Broadus John Rabinowize at the [facility].  Mr Letter denies chest pain or shortness of breath.  Has a history of Hypotension and abradycardia.  Fraser Din states that he has an irregular heart beat at times- - has 2 - 3 irregular beats the it beats regular again.  "It was worked up at [facility] in 2008 and found to be benign."

## 2016-05-01 ENCOUNTER — Inpatient Hospital Stay (HOSPITAL_COMMUNITY): Payer: BLUE CROSS/BLUE SHIELD

## 2016-05-01 ENCOUNTER — Encounter (HOSPITAL_COMMUNITY)
Admission: RE | Disposition: A | Discharge status: Home / Self Care | Payer: Self-pay | Source: Ambulatory Visit | Attending: Orthopedic Surgery

## 2016-05-01 ENCOUNTER — Inpatient Hospital Stay (HOSPITAL_COMMUNITY): Payer: BLUE CROSS/BLUE SHIELD | Admitting: Certified Registered"

## 2016-05-01 ENCOUNTER — Encounter (HOSPITAL_COMMUNITY): Payer: Self-pay | Admitting: Certified Registered"

## 2016-05-01 ENCOUNTER — Inpatient Hospital Stay (HOSPITAL_COMMUNITY)
Admission: RE | Admit: 2016-05-01 | Discharge: 2016-05-03 | DRG: 494 | Disposition: A | Discharge status: Home / Self Care | Payer: BLUE CROSS/BLUE SHIELD | Source: Ambulatory Visit | Attending: Orthopedic Surgery | Admitting: Orthopedic Surgery

## 2016-05-01 DIAGNOSIS — Z419 Encounter for procedure for purposes other than remedying health state, unspecified: Secondary | ICD-10-CM

## 2016-05-01 DIAGNOSIS — E785 Hyperlipidemia, unspecified: Secondary | ICD-10-CM | POA: Diagnosis present

## 2016-05-01 DIAGNOSIS — K219 Gastro-esophageal reflux disease without esophagitis: Secondary | ICD-10-CM | POA: Diagnosis present

## 2016-05-01 DIAGNOSIS — S82302M Unspecified fracture of lower end of left tibia, subsequent encounter for open fracture type I or II with nonunion: Secondary | ICD-10-CM | POA: Diagnosis present

## 2016-05-01 DIAGNOSIS — M6702 Short Achilles tendon (acquired), left ankle: Secondary | ICD-10-CM | POA: Diagnosis present

## 2016-05-01 DIAGNOSIS — M24572 Contracture, left ankle: Secondary | ICD-10-CM | POA: Diagnosis present

## 2016-05-01 DIAGNOSIS — S82202N Unspecified fracture of shaft of left tibia, subsequent encounter for open fracture type IIIA, IIIB, or IIIC with nonunion: Secondary | ICD-10-CM

## 2016-05-01 DIAGNOSIS — S82872A Displaced pilon fracture of left tibia, initial encounter for closed fracture: Secondary | ICD-10-CM | POA: Diagnosis present

## 2016-05-01 DIAGNOSIS — E559 Vitamin D deficiency, unspecified: Secondary | ICD-10-CM | POA: Diagnosis present

## 2016-05-01 DIAGNOSIS — I10 Essential (primary) hypertension: Secondary | ICD-10-CM | POA: Diagnosis present

## 2016-05-01 HISTORY — DX: Contracture, left ankle: M24.572

## 2016-05-01 HISTORY — PX: HARDWARE REMOVAL: SHX979

## 2016-05-01 HISTORY — DX: Pneumonia, unspecified organism: J18.9

## 2016-05-01 HISTORY — DX: Hypotension, unspecified: I95.9

## 2016-05-01 HISTORY — DX: Nausea with vomiting, unspecified: R11.2

## 2016-05-01 HISTORY — DX: Short Achilles tendon (acquired), left ankle: M67.02

## 2016-05-01 HISTORY — DX: Adverse effect of unspecified anesthetic, initial encounter: T41.45XA

## 2016-05-01 HISTORY — DX: Cardiac arrhythmia, unspecified: I49.9

## 2016-05-01 HISTORY — DX: Unspecified fracture of lower end of left tibia, subsequent encounter for open fracture type I or II with nonunion: S82.302M

## 2016-05-01 HISTORY — DX: Other specified postprocedural states: Z98.890

## 2016-05-01 HISTORY — PX: TIBIA IM NAIL INSERTION: SHX2516

## 2016-05-01 HISTORY — PX: ACHILLES TENDON LENGTHENING: SHX6455

## 2016-05-01 HISTORY — DX: Other complications of anesthesia, initial encounter: T88.59XA

## 2016-05-01 HISTORY — PX: CAPSULOTOMY: SHX379

## 2016-05-01 LAB — COMPREHENSIVE METABOLIC PANEL
ALT: 28 U/L (ref 17–63)
AST: 25 U/L (ref 15–41)
Albumin: 4 g/dL (ref 3.5–5.0)
Alkaline Phosphatase: 53 U/L (ref 38–126)
Anion gap: 11 (ref 5–15)
BUN: 13 mg/dL (ref 6–20)
CO2: 21 mmol/L — ABNORMAL LOW (ref 22–32)
Calcium: 9.5 mg/dL (ref 8.9–10.3)
Chloride: 107 mmol/L (ref 101–111)
Creatinine, Ser: 1 mg/dL (ref 0.61–1.24)
GFR calc Af Amer: >60 mL/min (ref >60)
GFR calc non Af Amer: >60 mL/min (ref >60)
Glucose, Bld: 105 mg/dL — ABNORMAL HIGH (ref 65–99)
Potassium: 4.1 mmol/L (ref 3.5–5.1)
Sodium: 139 mmol/L (ref 135–145)
Total Bilirubin: 1.6 mg/dL — ABNORMAL HIGH (ref 0.3–1.2)
Total Protein: 7 g/dL (ref 6.5–8.1)

## 2016-05-01 LAB — PROTIME-INR
INR: 1.05 (ref 0.00–1.49)
Prothrombin Time: 13.9 seconds (ref 11.6–15.2)

## 2016-05-01 LAB — CBC WITH DIFFERENTIAL/PLATELET
Basophils Absolute: 0 10*3/uL (ref 0.0–0.1)
Basophils Relative: 0 %
Eosinophils Absolute: 0.1 10*3/uL (ref 0.0–0.7)
Eosinophils Relative: 1 %
HCT: 44.7 % (ref 39.0–52.0)
Hemoglobin: 14.8 g/dL (ref 13.0–17.0)
Lymphocytes Relative: 21 %
Lymphs Abs: 1.8 10*3/uL (ref 0.7–4.0)
MCH: 29.4 pg (ref 26.0–34.0)
MCHC: 33.1 g/dL (ref 30.0–36.0)
MCV: 88.7 fL (ref 78.0–100.0)
Monocytes Absolute: 0.7 10*3/uL (ref 0.1–1.0)
Monocytes Relative: 8 %
Neutro Abs: 6 10*3/uL (ref 1.7–7.7)
Neutrophils Relative %: 70 %
Platelets: 239 10*3/uL (ref 150–400)
RBC: 5.04 MIL/uL (ref 4.22–5.81)
RDW: 13.3 % (ref 11.5–15.5)
WBC: 8.5 10*3/uL (ref 4.0–10.5)

## 2016-05-01 LAB — GRAM STAIN

## 2016-05-01 LAB — SEDIMENTATION RATE: Sed Rate: 3 mm/hr (ref 0–16)

## 2016-05-01 LAB — APTT: aPTT: 28 seconds (ref 24–37)

## 2016-05-01 LAB — C-REACTIVE PROTEIN: CRP: <0.5 mg/dL (ref <1.0)

## 2016-05-01 SURGERY — CAPSULOTOMY
Anesthesia: Regional | Site: Ankle | Laterality: Left

## 2016-05-01 MED ORDER — ROPIVACAINE HCL 5 MG/ML IJ SOLN
INTRAMUSCULAR | Status: DC | PRN
  Administered 2016-05-01: 20 mL via PERINEURAL

## 2016-05-01 MED ORDER — HYDROMORPHONE HCL 1 MG/ML IJ SOLN
INTRAMUSCULAR | Status: AC
  Filled 2016-05-01: qty 1

## 2016-05-01 MED ORDER — DIPHENHYDRAMINE HCL 50 MG/ML IJ SOLN: 12.5000 mg | Freq: Four times a day (QID) | INTRAMUSCULAR | Status: DC | PRN

## 2016-05-01 MED ORDER — NALOXONE HCL 0.4 MG/ML IJ SOLN: 0.4000 mg | INTRAMUSCULAR | Status: DC | PRN

## 2016-05-01 MED ORDER — ATORVASTATIN CALCIUM 10 MG PO TABS
10.0000 mg | ORAL_TABLET | Freq: Every day | ORAL | Status: DC
  Administered 2016-05-01 – 2016-05-02 (×2): 10 mg via ORAL
  Filled 2016-05-01 (×2): qty 1

## 2016-05-01 MED ORDER — ONDANSETRON HCL 4 MG/2ML IJ SOLN: 4.0000 mg | Freq: Four times a day (QID) | INTRAMUSCULAR | Status: DC | PRN

## 2016-05-01 MED ORDER — ROCURONIUM BROMIDE 100 MG/10ML IV SOLN
INTRAVENOUS | Status: DC | PRN
  Administered 2016-05-01: 10 mg via INTRAVENOUS
  Administered 2016-05-01: 50 mg via INTRAVENOUS
  Administered 2016-05-01: 10 mg via INTRAVENOUS
  Administered 2016-05-01: 20 mg via INTRAVENOUS

## 2016-05-01 MED ORDER — LIDOCAINE 2% (20 MG/ML) 5 ML SYRINGE
INTRAMUSCULAR | Status: AC
  Filled 2016-05-01: qty 5

## 2016-05-01 MED ORDER — METHOCARBAMOL 1000 MG/10ML IJ SOLN
500.0000 mg | INTRAVENOUS | Status: AC
  Filled 2016-05-01: qty 5

## 2016-05-01 MED ORDER — PROPOFOL 10 MG/ML IV BOLUS
INTRAVENOUS | Status: AC
  Filled 2016-05-01: qty 20

## 2016-05-01 MED ORDER — METOCLOPRAMIDE HCL 5 MG PO TABS: 5.0000 mg | ORAL_TABLET | Freq: Three times a day (TID) | ORAL | Status: DC | PRN

## 2016-05-01 MED ORDER — HYDROMORPHONE HCL 1 MG/ML IJ SOLN
0.2500 mg | INTRAMUSCULAR | Status: DC | PRN
  Administered 2016-05-01 (×4): 0.5 mg via INTRAVENOUS

## 2016-05-01 MED ORDER — DIAZEPAM 5 MG PO TABS
5.0000 mg | ORAL_TABLET | Freq: Every day | ORAL | Status: DC | PRN
  Administered 2016-05-01: 5 mg via ORAL

## 2016-05-01 MED ORDER — PANTOPRAZOLE SODIUM 40 MG PO TBEC
40.0000 mg | DELAYED_RELEASE_TABLET | Freq: Every day | ORAL | Status: DC
  Filled 2016-05-01: qty 1

## 2016-05-01 MED ORDER — OXYCODONE HCL 5 MG PO TABS
5.0000 mg | ORAL_TABLET | Freq: Four times a day (QID) | ORAL | Status: DC | PRN
  Administered 2016-05-01 – 2016-05-02 (×2): 5 mg via ORAL
  Administered 2016-05-02 (×2): 10 mg via ORAL
  Administered 2016-05-03: 5 mg via ORAL
  Filled 2016-05-01: qty 2
  Filled 2016-05-01: qty 1
  Filled 2016-05-01: qty 2
  Filled 2016-05-01: qty 1
  Filled 2016-05-01 (×2): qty 2

## 2016-05-01 MED ORDER — ONDANSETRON HCL 4 MG/2ML IJ SOLN
INTRAMUSCULAR | Status: AC
  Filled 2016-05-01: qty 2

## 2016-05-01 MED ORDER — ENOXAPARIN SODIUM 30 MG/0.3ML ~~LOC~~ SOLN
30.0000 mg | SUBCUTANEOUS | Status: DC
  Administered 2016-05-02: 30 mg via SUBCUTANEOUS
  Filled 2016-05-01: qty 0.3

## 2016-05-01 MED ORDER — OXYCODONE HCL 5 MG PO TABS
ORAL_TABLET | ORAL | Status: AC
  Filled 2016-05-01: qty 1

## 2016-05-01 MED ORDER — LORATADINE 10 MG PO TABS
10.0000 mg | ORAL_TABLET | Freq: Every day | ORAL | Status: DC
  Filled 2016-05-01: qty 1

## 2016-05-01 MED ORDER — CEFAZOLIN SODIUM 1-5 GM-% IV SOLN
1.0000 g | Freq: Four times a day (QID) | INTRAVENOUS | Status: AC
  Administered 2016-05-01 – 2016-05-02 (×3): 1 g via INTRAVENOUS
  Filled 2016-05-01 (×3): qty 50

## 2016-05-01 MED ORDER — GLYCOPYRROLATE 0.2 MG/ML IJ SOLN
INTRAMUSCULAR | Status: DC | PRN
  Administered 2016-05-01: 0.2 mg via INTRAVENOUS

## 2016-05-01 MED ORDER — DIPHENHYDRAMINE HCL 12.5 MG/5ML PO ELIX: 12.5000 mg | ORAL_SOLUTION | ORAL | Status: DC | PRN

## 2016-05-01 MED ORDER — EPHEDRINE SULFATE 50 MG/ML IJ SOLN
INTRAMUSCULAR | Status: DC | PRN
  Administered 2016-05-01: 5 mg via INTRAVENOUS
  Administered 2016-05-01 (×3): 10 mg via INTRAVENOUS

## 2016-05-01 MED ORDER — ACETAMINOPHEN 650 MG RE SUPP: 650.0000 mg | Freq: Four times a day (QID) | RECTAL | Status: DC | PRN

## 2016-05-01 MED ORDER — ONDANSETRON HCL 4 MG/2ML IJ SOLN: 4.0000 mg | Freq: Once | INTRAMUSCULAR | Status: DC | PRN

## 2016-05-01 MED ORDER — TAPENTADOL HCL 50 MG PO TABS: 50.0000 mg | ORAL_TABLET | Freq: Four times a day (QID) | ORAL | Status: DC | PRN

## 2016-05-01 MED ORDER — DIAZEPAM 5 MG PO TABS
ORAL_TABLET | ORAL | Status: AC
  Filled 2016-05-01: qty 1

## 2016-05-01 MED ORDER — OXYCODONE HCL 5 MG/5ML PO SOLN: 5.0000 mg | Freq: Once | ORAL | Status: AC | PRN

## 2016-05-01 MED ORDER — CEFAZOLIN SODIUM 1 G IJ SOLR
INTRAMUSCULAR | Status: DC | PRN
  Administered 2016-05-01: 2 g via INTRAMUSCULAR

## 2016-05-01 MED ORDER — ROCURONIUM BROMIDE 50 MG/5ML IV SOLN
INTRAVENOUS | Status: AC
  Filled 2016-05-01: qty 1

## 2016-05-01 MED ORDER — SENNOSIDES-DOCUSATE SODIUM 8.6-50 MG PO TABS: 1.0000 | ORAL_TABLET | Freq: Every evening | ORAL | Status: DC | PRN

## 2016-05-01 MED ORDER — FENTANYL CITRATE (PF) 100 MCG/2ML IJ SOLN
INTRAMUSCULAR | Status: AC
  Administered 2016-05-01: 50 ug
  Filled 2016-05-01: qty 2

## 2016-05-01 MED ORDER — ONDANSETRON HCL 4 MG/2ML IJ SOLN
4.0000 mg | Freq: Four times a day (QID) | INTRAMUSCULAR | Status: DC | PRN
  Administered 2016-05-01 (×2): 4 mg via INTRAVENOUS
  Filled 2016-05-01 (×2): qty 2

## 2016-05-01 MED ORDER — LIDOCAINE HCL (CARDIAC) 20 MG/ML IV SOLN
INTRAVENOUS | Status: DC | PRN
  Administered 2016-05-01: 40 mg via INTRAVENOUS

## 2016-05-01 MED ORDER — PROPOFOL 10 MG/ML IV BOLUS
INTRAVENOUS | Status: DC | PRN
  Administered 2016-05-01: 20 mg via INTRAVENOUS
  Administered 2016-05-01: 200 mg via INTRAVENOUS
  Administered 2016-05-01: 50 mg via INTRAVENOUS

## 2016-05-01 MED ORDER — TAPENTADOL HCL 50 MG PO TABS
50.0000 mg | ORAL_TABLET | Freq: Four times a day (QID) | ORAL | Status: DC | PRN
  Administered 2016-05-02 (×2): 100 mg via ORAL
  Administered 2016-05-03 (×2): 50 mg via ORAL
  Administered 2016-05-03: 100 mg via ORAL
  Filled 2016-05-01 (×4): qty 2
  Filled 2016-05-01: qty 1

## 2016-05-01 MED ORDER — ONDANSETRON HCL 4 MG/2ML IJ SOLN
INTRAMUSCULAR | Status: DC | PRN
  Administered 2016-05-01 (×2): 4 mg via INTRAVENOUS

## 2016-05-01 MED ORDER — METHOCARBAMOL 500 MG PO TABS
500.0000 mg | ORAL_TABLET | Freq: Four times a day (QID) | ORAL | Status: DC | PRN
  Administered 2016-05-01 – 2016-05-02 (×3): 500 mg via ORAL
  Filled 2016-05-01 (×3): qty 1

## 2016-05-01 MED ORDER — MIDAZOLAM HCL 2 MG/2ML IJ SOLN
INTRAMUSCULAR | Status: AC
  Filled 2016-05-01: qty 2

## 2016-05-01 MED ORDER — LACTATED RINGERS IV SOLN
INTRAVENOUS | Status: DC
  Administered 2016-05-01 (×2): via INTRAVENOUS

## 2016-05-01 MED ORDER — ACETAMINOPHEN 325 MG PO TABS: 650.0000 mg | ORAL_TABLET | Freq: Four times a day (QID) | ORAL | Status: DC | PRN

## 2016-05-01 MED ORDER — FENTANYL CITRATE (PF) 250 MCG/5ML IJ SOLN
INTRAMUSCULAR | Status: DC | PRN
  Administered 2016-05-01 (×2): 50 ug via INTRAVENOUS

## 2016-05-01 MED ORDER — METOCLOPRAMIDE HCL 5 MG/ML IJ SOLN
5.0000 mg | Freq: Three times a day (TID) | INTRAMUSCULAR | Status: DC | PRN
  Administered 2016-05-01: 10 mg via INTRAVENOUS
  Filled 2016-05-01 (×3): qty 2

## 2016-05-01 MED ORDER — OXYCODONE HCL 5 MG PO TABS
5.0000 mg | ORAL_TABLET | Freq: Once | ORAL | Status: AC | PRN
  Administered 2016-05-01: 5 mg via ORAL

## 2016-05-01 MED ORDER — HYDROMORPHONE HCL 1 MG/ML IJ SOLN
0.5000 mg | INTRAMUSCULAR | Status: DC | PRN
  Administered 2016-05-01 (×4): 0.5 mg via INTRAVENOUS

## 2016-05-01 MED ORDER — METHOCARBAMOL 1000 MG/10ML IJ SOLN
500.0000 mg | Freq: Four times a day (QID) | INTRAVENOUS | Status: DC | PRN
  Filled 2016-05-01: qty 5

## 2016-05-01 MED ORDER — ADULT MULTIVITAMIN W/MINERALS CH
1.0000 | ORAL_TABLET | Freq: Every day | ORAL | Status: DC
  Filled 2016-05-01 (×2): qty 1

## 2016-05-01 MED ORDER — TRAMADOL HCL 50 MG PO TABS
50.0000 mg | ORAL_TABLET | Freq: Four times a day (QID) | ORAL | Status: DC | PRN
  Administered 2016-05-01: 50 mg via ORAL
  Administered 2016-05-02: 100 mg via ORAL
  Administered 2016-05-02 (×3): 50 mg via ORAL
  Administered 2016-05-03 (×2): 100 mg via ORAL
  Filled 2016-05-01 (×2): qty 1
  Filled 2016-05-01: qty 2
  Filled 2016-05-01 (×2): qty 1
  Filled 2016-05-01 (×2): qty 2

## 2016-05-01 MED ORDER — DOCUSATE SODIUM 100 MG PO CAPS
100.0000 mg | ORAL_CAPSULE | Freq: Two times a day (BID) | ORAL | Status: DC
  Administered 2016-05-01 – 2016-05-03 (×4): 100 mg via ORAL
  Filled 2016-05-01 (×4): qty 1

## 2016-05-01 MED ORDER — EPHEDRINE 5 MG/ML INJ
INTRAVENOUS | Status: AC
  Filled 2016-05-01: qty 10

## 2016-05-01 MED ORDER — POTASSIUM CHLORIDE IN NACL 20-0.9 MEQ/L-% IV SOLN
INTRAVENOUS | Status: DC
  Administered 2016-05-01: 17:00:00 via INTRAVENOUS
  Filled 2016-05-01: qty 1000

## 2016-05-01 MED ORDER — FENTANYL 40 MCG/ML IV SOLN
INTRAVENOUS | Status: AC
  Filled 2016-05-01: qty 25

## 2016-05-01 MED ORDER — MIDAZOLAM HCL 2 MG/2ML IJ SOLN
INTRAMUSCULAR | Status: AC
  Administered 2016-05-01: 2 mg
  Filled 2016-05-01: qty 2

## 2016-05-01 MED ORDER — ONDANSETRON HCL 4 MG PO TABS: 4.0000 mg | ORAL_TABLET | Freq: Four times a day (QID) | ORAL | Status: DC | PRN

## 2016-05-01 MED ORDER — 0.9 % SODIUM CHLORIDE (POUR BTL) OPTIME
TOPICAL | Status: DC | PRN
  Administered 2016-05-01: 1000 mL

## 2016-05-01 MED ORDER — DIPHENHYDRAMINE HCL 12.5 MG/5ML PO ELIX: 12.5000 mg | ORAL_SOLUTION | Freq: Four times a day (QID) | ORAL | Status: DC | PRN

## 2016-05-01 MED ORDER — CHLORHEXIDINE GLUCONATE 4 % EX LIQD
60.0000 mL | Freq: Once | CUTANEOUS | Status: DC
Start: 2016-05-01 — End: 2016-05-01

## 2016-05-01 MED ORDER — BUPIVACAINE HCL (PF) 0.25 % IJ SOLN
INTRAMUSCULAR | Status: DC | PRN
  Administered 2016-05-01: 30 mL

## 2016-05-01 MED ORDER — FENTANYL CITRATE (PF) 250 MCG/5ML IJ SOLN
INTRAMUSCULAR | Status: AC
  Filled 2016-05-01: qty 5

## 2016-05-01 MED ORDER — SODIUM CHLORIDE 0.9% FLUSH: 9.0000 mL | INTRAVENOUS | Status: DC | PRN

## 2016-05-01 MED ORDER — LACTATED RINGERS IV SOLN
INTRAVENOUS | Status: DC
  Filled 2016-05-01: qty 1000

## 2016-05-01 MED ORDER — SODIUM CHLORIDE 0.9 % IR SOLN
Status: DC | PRN
  Administered 2016-05-01: 3000 mL

## 2016-05-01 MED ORDER — SUGAMMADEX SODIUM 200 MG/2ML IV SOLN
INTRAVENOUS | Status: DC | PRN
  Administered 2016-05-01: 200 mg via INTRAVENOUS

## 2016-05-01 MED ORDER — FENTANYL 40 MCG/ML IV SOLN
INTRAVENOUS | Status: DC
  Administered 2016-05-01: 75 ug via INTRAVENOUS
  Administered 2016-05-01: 16:00:00 via INTRAVENOUS
  Administered 2016-05-02: 90 ug via INTRAVENOUS
  Administered 2016-05-02: 30 ug via INTRAVENOUS
  Administered 2016-05-02: 0 ug via INTRAVENOUS

## 2016-05-01 MED ORDER — CEFAZOLIN SODIUM 1 G IJ SOLR
INTRAMUSCULAR | Status: AC
  Filled 2016-05-01: qty 20

## 2016-05-01 SURGICAL SUPPLY — 81 items
BANDAGE ACE 4X5 VEL STRL LF (GAUZE/BANDAGES/DRESSINGS) ×3 IMPLANT
BANDAGE ACE 6X5 VEL STRL LF (GAUZE/BANDAGES/DRESSINGS) ×3 IMPLANT
BANDAGE ELASTIC 4 VELCRO ST LF (GAUZE/BANDAGES/DRESSINGS) ×3 IMPLANT
BANDAGE ELASTIC 6 VELCRO ST LF (GAUZE/BANDAGES/DRESSINGS) ×3 IMPLANT
BIT DRILL 2.5X2.75 QC CALB (BIT) ×3 IMPLANT
BIT DRILL CALIBRATED 2.7 (BIT) ×3 IMPLANT
BLADE SURG 10 STRL SS (BLADE) ×6 IMPLANT
BNDG GAUZE ELAST 4 BULKY (GAUZE/BANDAGES/DRESSINGS) ×3 IMPLANT
BRUSH SCRUB DISP (MISCELLANEOUS) ×6 IMPLANT
CLIP LOCKING FOR RIA (CLIP) ×3 IMPLANT
COVER SURGICAL LIGHT HANDLE (MISCELLANEOUS) ×6 IMPLANT
CUFF TOURNIQUET SINGLE 34IN LL (TOURNIQUET CUFF) ×3 IMPLANT
DRAPE C-ARM 42X72 X-RAY (DRAPES) ×3 IMPLANT
DRAPE C-ARMOR (DRAPES) ×3 IMPLANT
DRAPE EXTREMITY T 121X128X90 (DRAPE) ×6 IMPLANT
DRAPE INCISE IOBAN 66X45 STRL (DRAPES) ×3 IMPLANT
DRAPE ORTHO SPLIT 77X108 STRL (DRAPES) ×2
DRAPE PROXIMA HALF (DRAPES) ×3 IMPLANT
DRAPE SURG ORHT 6 SPLT 77X108 (DRAPES) ×4 IMPLANT
DRIVE SHAFT RIA ×3 IMPLANT
DRIVE SHAFT SEAL STERILE ×3 IMPLANT
DRSG ADAPTIC 3X8 NADH LF (GAUZE/BANDAGES/DRESSINGS) ×3 IMPLANT
DRSG MEPILEX BORDER 4X4 (GAUZE/BANDAGES/DRESSINGS) ×3 IMPLANT
DRSG MEPITEL 4X7.2 (GAUZE/BANDAGES/DRESSINGS) ×3 IMPLANT
DRSG PAD ABDOMINAL 8X10 ST (GAUZE/BANDAGES/DRESSINGS) ×3 IMPLANT
ELECT REM PT RETURN 9FT ADLT (ELECTROSURGICAL) ×3
ELECTRODE REM PT RTRN 9FT ADLT (ELECTROSURGICAL) ×2 IMPLANT
EVACUATOR 1/8 PVC DRAIN (DRAIN) IMPLANT
GAUZE SPONGE 4X4 12PLY STRL (GAUZE/BANDAGES/DRESSINGS) ×3 IMPLANT
GLOVE BIO SURGEON STRL SZ7.5 (GLOVE) ×3 IMPLANT
GLOVE BIO SURGEON STRL SZ8 (GLOVE) ×3 IMPLANT
GLOVE BIO SURGEON STRL SZ8.5 (GLOVE) ×3 IMPLANT
GLOVE BIOGEL PI IND STRL 6.5 (GLOVE) ×6 IMPLANT
GLOVE BIOGEL PI IND STRL 7.5 (GLOVE) ×2 IMPLANT
GLOVE BIOGEL PI IND STRL 8 (GLOVE) ×2 IMPLANT
GLOVE BIOGEL PI INDICATOR 6.5 (GLOVE) ×3
GLOVE BIOGEL PI INDICATOR 7.5 (GLOVE) ×1
GLOVE BIOGEL PI INDICATOR 8 (GLOVE) ×1
GLOVE SURG SS PI 7.5 STRL IVOR (GLOVE) ×3 IMPLANT
GLOVE SURG SS PI 8.0 STRL IVOR (GLOVE) ×15 IMPLANT
GOWN STRL REUS W/ TWL LRG LVL3 (GOWN DISPOSABLE) ×4 IMPLANT
GOWN STRL REUS W/ TWL XL LVL3 (GOWN DISPOSABLE) ×2 IMPLANT
GOWN STRL REUS W/TWL LRG LVL3 (GOWN DISPOSABLE) ×2
GOWN STRL REUS W/TWL XL LVL3 (GOWN DISPOSABLE) ×1
GRAFT FILTER FOR RIA 520 LGTH (MISCELLANEOUS) ×3 IMPLANT
KIT BASIN OR (CUSTOM PROCEDURE TRAY) ×3 IMPLANT
KIT ROOM TURNOVER OR (KITS) ×3 IMPLANT
PACK GENERAL/GYN (CUSTOM PROCEDURE TRAY) ×3 IMPLANT
PAD ARMBOARD 7.5X6 YLW CONV (MISCELLANEOUS) ×6 IMPLANT
PADDING CAST COTTON 6X4 STRL (CAST SUPPLIES) ×3 IMPLANT
PLATE 6H 115 LT DIST ANTLAT TB (Plate) ×3 IMPLANT
REAMER HEAD 14MM (MISCELLANEOUS) ×3 IMPLANT
REAMER ROD DEEP FLUTE 2.5X950 (INSTRUMENTS) ×3 IMPLANT
SCREW CORT FT 32X3.5XNONLOCK (Screw) ×4 IMPLANT
SCREW CORTICAL 3.5MM  30MM (Screw) ×1 IMPLANT
SCREW CORTICAL 3.5MM  32MM (Screw) ×2 IMPLANT
SCREW CORTICAL 3.5MM 30MM (Screw) ×2 IMPLANT
SCREW LOCK CORT STAR 3.5X40 (Screw) ×3 IMPLANT
SCREW LOCK CORT STAR 3.5X42 (Screw) ×3 IMPLANT
SCREW LOCK CORT STAR 3.5X46 (Screw) ×6 IMPLANT
SPLINT PLASTER CAST XFAST 5X30 (CAST SUPPLIES) ×2 IMPLANT
SPLINT PLASTER XFAST SET 5X30 (CAST SUPPLIES) ×1
SPONGE GAUZE 4X4 12PLY STER LF (GAUZE/BANDAGES/DRESSINGS) ×3 IMPLANT
STAPLER VISISTAT 35W (STAPLE) ×3 IMPLANT
STRIP CLOSURE SKIN 1/2X4 (GAUZE/BANDAGES/DRESSINGS) ×3 IMPLANT
SUCTION FRAZIER TIP 10 FR DISP (SUCTIONS) ×3 IMPLANT
SUT PROLENE 6 0 C 1 24 (SUTURE) ×3 IMPLANT
SUT VIC AB 0 CT1 27 (SUTURE) ×1
SUT VIC AB 0 CT1 27XBRD ANBCTR (SUTURE) ×2 IMPLANT
SUT VIC AB 2-0 CT1 27 (SUTURE) ×1
SUT VIC AB 2-0 CT1 TAPERPNT 27 (SUTURE) ×2 IMPLANT
SUT VIC AB 2-0 CT3 27 (SUTURE) IMPLANT
SWAB COLLECTION DEVICE MRSA (MISCELLANEOUS) ×3 IMPLANT
SWAB CULTURE LIQUID MINI MALE (MISCELLANEOUS) ×3 IMPLANT
TOWEL OR 17X24 6PK STRL BLUE (TOWEL DISPOSABLE) ×3 IMPLANT
TOWEL OR 17X26 10 PK STRL BLUE (TOWEL DISPOSABLE) ×6 IMPLANT
TRAY FOLEY CATH 16FRSI W/METER (SET/KITS/TRAYS/PACK) ×3 IMPLANT
TUBE ASSEMBLY RIA STERILE (MISCELLANEOUS) ×3 IMPLANT
TUBE CONNECTING 12X1/4 (SUCTIONS) ×3 IMPLANT
TUBING TUR DISP (UROLOGICAL SUPPLIES) ×3 IMPLANT
YANKAUER SUCT BULB TIP NO VENT (SUCTIONS) IMPLANT

## 2016-05-01 NOTE — H&P (Signed)
Orthopaedic Trauma Service H&P    Chief Complaint: L distal tibia nonunion and L ankle contracture  HPI:   60 year old white male well known to the orthopedic trauma service for open left distal tibia fracture in November 2016. Initially treated with external fixation followed by ORIF 11/28/2015. Patient was also found to have vitamin D insufficiency. He was again treated with ORIF. He has progressed well however he is set at plateau. 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 Patient is also has a left ankle contracture. Patient presents today for repair of his nonunion as well as exchange of his hardware.   Past Medical History  Diagnosis Date  . Viral meningitis 2003  . Bacterial meningitis 2006  . [place] spotted fever 2006  . CSF leak 2006  . Shingles   . Meningitis   . Dysphagia   . Hyperlipidemia   . GERD (gastroesophageal reflux disease)   . Open left trimalleolar fracture 11/30/2015  . Vitamin D insufficiency 12/02/2015  . Dysrhythmia     2 - 3 fast beats then returns to noraml  . Pneumonia     10- 12 years ago.   Marland Kitchen Headache     daily- post LP from tick bite- when standing  . 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 Hypotension   . Complication of anesthesia     Hyptension and Bradycardia  . PONV (postoperative nausea and vomiting)     11/2015- no nausea- treated pre- op with Zofran    Past Surgical History  Procedure Laterality Date  . Appendectomy  1970  . Laminectomy  [id number]    CSF leak- from LP   . Knee arthroscopy with anterior cruciate ligament (acl) repair Right 1988 ACL , 1992 ACL  . Skin cancer removal  2000-2001  . Colonoscopy    . Esophagogastroduodenoscopy    . Colonoscopy with propofol N/A 07/01/2015    Procedure: COLONOSCOPY WITH PROPOFOL;  Surgeon: Manya Silvas, MD;  Location: [facility] ENDOSCOPY;  Service: Endoscopy;  Laterality: N/A;  . Esophagogastroduodenoscopy  N/A 07/01/2015    Procedure: ESOPHAGOGASTRODUODENOSCOPY (EGD);  Surgeon: Manya Silvas, MD;  Location: [facility] ENDOSCOPY;  Service: Endoscopy;  Laterality: N/A;  . Savory dilation N/A 07/01/2015    Procedure: SAVORY DILATION;  Surgeon: Manya Silvas, MD;  Location[facility] ENDOSCOPY;  Service: Endoscopy;  Laterality: N/A;  . Ankle fracture surgery Left 11/22/2015    Exteral fixation  . External fixation leg Left 11/22/2015    Procedure: EXTERNAL FIXATION LEFT  LEG AND IRRIGATION AND DEBRIDEMENT AND PINNING;  Surgeon: Dorna Leitz, MD;  Location: [place];  Service: Orthopedics;  Laterality: Left;  . Orif tibia fracture Left 11/28/2015    Procedure: OPEN REDUCTION INTERNAL FIXATION (ORIF)LEFT  TIBIA  ANKLE FRACTURE;  Surgeon: Altamese Washburn, MD;  Location: [place];  Service: Orthopedics;  Laterality: Left;  . External fixation removal Left 11/28/2015    Procedure: REMOVAL EXTERNAL FIXATION LEG;  Surgeon: Altamese Vienna, MD;  Location: [place];  Service: Orthopedics;  Laterality: Left;  . Arthroscopic repair acl Right     Family History  Problem Relation Age of Onset  . Cancer Mother    Social History:  reports that he has never smoked. He has never used smokeless tobacco. He reports that he does not drink alcohol or use illicit drugs.  Allergies:  Allergies  Allergen Reactions  . Tylenol [Acetaminophen] Other (See Comments)  Headaches worsen with Tylenol due to CSF  . Lodine [Etodolac] Rash  . Prednisolone Rash    No prescriptions prior to admission    No results found for this or any previous visit (from the past 48 hour(s)). No results found.  Review of Systems  Constitutional: Negative for fever and chills.  Respiratory: Negative for shortness of breath and wheezing.   Cardiovascular: Negative for chest pain and palpitations.  Gastrointestinal: Negative for nausea, vomiting and abdominal pain.  Genitourinary: Negative for dysuria.  Musculoskeletal:       L ankle pain   Skin: Negative  for rash.  Neurological: Negative for tingling and sensory change.    Height 6" (0.152 m), weight 82.555 kg (182 lb). Physical Exam  Constitutional: Vital signs are normal. He appears well-developed and well-nourished. He is cooperative. No distress.  HENT:  Head: Normocephalic and atraumatic.  Eyes: EOM are normal. Pupils are equal, round, and reactive to light.  Cardiovascular: Normal rate, regular rhythm, S1 normal and S2 normal.   Respiratory: Effort normal and breath sounds normal. No accessory muscle usage. No respiratory distress. He has no wheezes.  GI: Soft. Normal appearance and bowel sounds are normal. He exhibits no distension. There is no tenderness.  Musculoskeletal:  Left Lower Extremity     Minimal subtalar motion     Surgical and traumatic wounds are healed    Hypersensitivity of foot noted     Ecchymosis to foot noted, scattered appearance    Ankle ROM 0-25 degrees of flexion     TTP L distal tibia     Residual swelling noted     DPN, SPN, TN sensation grossly intact    EHL, FHL, lesser toe motor intact         Neurological: He is alert.  Psychiatric: He has a normal mood and affect. His speech is normal and behavior is normal. Judgment and thought content normal. Cognition and memory are normal.     Assessment/Plan  60 year old white male status post open left pilon fracture with left distal tibia nonunion and left contracture  - left distal tibia nonunion and left contracture  OR for repair of L distal tibia nonunion   Exchange of hardware  ICBG vs RIA   Achilles tendon lengthening   NWB post op   Bone stimulator   - Pain management:  Continue with nucynta   Add oxy IR and ultram post op    - DVT/PE prophylaxis:  lovenox 30 mg sq daily due to previous h/o CSF leak  - ID:   Hold pre op abx   - Metabolic Bone Disease:  F/u on labs    - FEN/Foley/Lines:  NPO   - Dispo:  OR for repair of L distal tibia nonunion   Jari Pigg,  PA-C [facility] [phone] (P) 05/01/2016, 8:11 AM

## 2016-05-01 NOTE — OR Nursing (Signed)
Pt c/o bladder pressure, unable to initiate stream. Bladder scanner indicating approx 650 in retained urine. In and out cath performed with NT observing for sterile technique. 800 ml out and pt expressed great relief.

## 2016-05-01 NOTE — Anesthesia Procedure Notes (Addendum)
Anesthesia Regional Block:  Adductor canal block  Pre-Anesthetic Checklist: ,, timeout performed, Correct Patient, Correct Site, Correct Laterality, Correct Procedure, Correct Position, site marked, Risks and benefits discussed,  Surgical consent,  Pre-op evaluation,  At surgeon's request and post-op pain management  Laterality: Left  Prep: chloraprep       Needles:  Injection technique: Single-shot  Needle Type: Stimiplex     Needle Length: 9cm 9 cm Needle Gauge: 21 and 21 G    Additional Needles:  Procedures: ultrasound guided (picture in chart) Adductor canal block Narrative:  Injection made incrementally with aspirations every 5 mL.  Performed by: Personally  Anesthesiologist: JUDD, BENJAMIN  Additional Notes: Risks, benefits and alternative to block explained extensively.  Patient tolerated procedure well, without complications.   Anesthesia Regional Block:  Popliteal block  Pre-Anesthetic Checklist: ,, timeout performed, Correct Patient, Correct Site, Correct Laterality, Correct Procedure, Correct Position, site marked, Risks and benefits discussed,  Surgical consent,  Pre-op evaluation,  At surgeon's request and post-op pain management  Laterality: Left  Prep: chloraprep       Needles:  Injection technique: Single-shot  Needle Type: Stimiplex     Needle Length: 9cm 9 cm Needle Gauge: 21 and 21 G    Additional Needles:  Procedures: ultrasound guided (picture in chart) Popliteal block Narrative:  Injection made incrementally with aspirations every 5 mL.  Performed by: Personally  Anesthesiologist: JUDD, BENJAMIN  Additional Notes: Risks, benefits and alternative to block explained extensively.  Patient tolerated procedure well, without complications.   Procedure Name: Intubation Date/Time: 05/01/2016 10:48 AM Performed by: Sampson Si E Pre-anesthesia Checklist: Patient identified, Emergency Drugs available, Suction available, Patient being  monitored and Timeout performed Patient Re-evaluated:Patient Re-evaluated prior to inductionOxygen Delivery Method: Circle system utilized Preoxygenation: Pre-oxygenation with 100% oxygen Intubation Type: IV induction Ventilation: Mask ventilation without difficulty Laryngoscope Size: Mac and 4 Grade View: Grade I Tube type: Oral Tube size: 7.5 mm Number of attempts: 1 Airway Equipment and Method: Stylet Placement Confirmation: ETT inserted through vocal cords under direct vision,  positive ETCO2 and breath sounds checked- equal and bilateral Secured at: 22 cm Tube secured with: Tape Dental Injury: Teeth and Oropharynx as per pre-operative assessment

## 2016-05-01 NOTE — Anesthesia Preprocedure Evaluation (Addendum)
Anesthesia Evaluation  Patient identified by MRN, date of birth, ID band Patient awake    Reviewed: Allergy & Precautions, H&P , NPO status , Patient's Chart, lab work & pertinent test results  History of Anesthesia Complications (+) PONV and history of anesthetic complications  Airway Mallampati: II  TM Distance: >3 FB Neck ROM: full    Dental no notable dental hx.    Pulmonary neg pulmonary ROS,    Pulmonary exam normal breath sounds clear to auscultation       Cardiovascular hypertension, negative cardio ROS Normal cardiovascular exam Rhythm:regular Rate:Normal     Neuro/Psych negative neurological ROS     GI/Hepatic Neg liver ROS, GERD  Medicated,  Endo/Other  negative endocrine ROS  Renal/GU negative Renal ROS     Musculoskeletal   Abdominal   Peds  Hematology negative hematology ROS (+)   Anesthesia Other Findings   Reproductive/Obstetrics negative OB ROS                             Anesthesia Physical Anesthesia Plan  ASA: II  Anesthesia Plan: General and Regional   Post-op Pain Management: GA combined w/ Regional for post-op pain   Induction: Intravenous  Airway Management Planned: LMA  Additional Equipment:   Intra-op Plan:   Post-operative Plan: Extubation in OR  Informed Consent: I have reviewed the patients History and Physical, chart, labs and discussed the procedure including the risks, benefits and alternatives for the proposed anesthesia with the patient or authorized representative who has indicated his/her understanding and acceptance.   Dental Advisory Given  Plan Discussed with: Anesthesiologist, CRNA and Surgeon  Anesthesia Plan Comments:         Anesthesia Quick Evaluation                                   Anesthesia Evaluation  Patient identified by MRN, date of birth, ID band Patient awake    Reviewed: Allergy & Precautions, NPO  status , Patient's Chart, lab work & pertinent test results  History of Anesthesia Complications Negative for: history of anesthetic complications  Airway Mallampati: I  TM Distance: >3 FB Neck ROM: Full    Dental  (+) Teeth Intact, Dental Advisory Given   Pulmonary neg pulmonary ROS,    breath sounds clear to auscultation       Cardiovascular hypertension,  Rhythm:Regular Rate:Normal     Neuro/Psych  Headaches, negative psych ROS   GI/Hepatic Neg liver ROS, GERD  Medicated and Controlled,  Endo/Other  negative endocrine ROS  Renal/GU negative Renal ROS     Musculoskeletal   Abdominal (+)  Abdomen: soft. Bowel sounds: normal.  Peds  Hematology negative hematology ROS (+)   Anesthesia Other Findings   Reproductive/Obstetrics                            BP Readings from Last 3 Encounters:  05/01/16 132/73  11/30/15 121/64  07/01/15 121/79   Lab Results  Component Value Date   WBC 11.7* 11/30/2015   HGB 10.8* 11/30/2015   HCT 33.8* 11/30/2015   MCV 92.3 11/30/2015   PLT 283 11/30/2015     Chemistry      Component Value Date/Time   NA 135 11/30/2015 0540   K 4.1 11/30/2015 0540   CL 101 11/30/2015 0540   CO2  28 11/30/2015 0540   BUN 12 11/30/2015 0540   CREATININE 1.07 11/30/2015 0540      Component Value Date/Time   CALCIUM 8.3* 11/30/2015 0540   CALCIUM 8.7 11/28/2015 1003   ALKPHOS 117 11/28/2015 1003   AST 56* 11/28/2015 1003   ALT 91* 11/28/2015 1003   BILITOT 1.1 11/28/2015 1003       Anesthesia Physical Anesthesia Plan  ASA: III  Anesthesia Plan: General and Regional   Post-op Pain Management: GA combined w/ Regional for post-op pain   Induction: Intravenous  Airway Management Planned: Oral ETT  Additional Equipment:   Intra-op Plan:   Post-operative Plan: Extubation in OR  Informed Consent: I have reviewed the patients History and Physical, chart, labs and discussed the procedure  including the risks, benefits and alternatives for the proposed anesthesia with the patient or authorized representative who has indicated his/her understanding and acceptance.   Dental advisory given  Plan Discussed with: CRNA, Anesthesiologist and Surgeon  Anesthesia Plan Comments:        Anesthesia Quick Evaluation

## 2016-05-01 NOTE — Transfer of Care (Signed)
Immediate Anesthesia Transfer of Care Note  Patient: Randy Bennett  Procedure(s) Performed: Procedure(s): LEFT ANKLE CAPSULOTOMY, LEFT ANKLE MANIPULATION (Left) LEFT ACHILLES TENDON LENGTHENING (Left) LEFT TIBIA REPAIR WITH AUTOGRAFTING, LEFT REAMED INTRAMEDULLARY ASPIRATION (Left) HARDWARE REMOVAL LEFT DISTAL TIBIA WITH REVISION OF FIXATION,  (Left)  Patient Location: PACU  Anesthesia Type:General  Level of Consciousness: awake and oriented  Airway & Oxygen Therapy: Patient Spontanous Breathing and Patient connected to nasal cannula oxygen  Post-op Assessment: Report given to RN  Post vital signs: Reviewed and stable  Last Vitals:  Filed Vitals:   05/01/16 1025 05/01/16 1030  BP: 119/71 106/56  Pulse: 72 66  Temp:    Resp:      Last Pain:  Filed Vitals:   05/01/16 1032  PainSc: 4       Patients Stated Pain Goal: 2 ([id number])  Complications: No apparent anesthesia complications

## 2016-05-01 NOTE — Anesthesia Postprocedure Evaluation (Signed)
Anesthesia Post Note  Patient: Randy Bennett  Procedure(s) Performed: Procedure(s) (LRB): LEFT ANKLE CAPSULOTOMY, LEFT ANKLE MANIPULATION (Left) LEFT ACHILLES TENDON LENGTHENING (Left) LEFT TIBIA REPAIR WITH AUTOGRAFTING, LEFT REAMED INTRAMEDULLARY ASPIRATION (Left) HARDWARE REMOVAL LEFT DISTAL TIBIA WITH REVISION OF FIXATION,  (Left)  Patient location during evaluation: PACU Anesthesia Type: General and Regional Level of consciousness: awake and alert Pain management: pain level controlled Vital Signs Assessment: post-procedure vital signs reviewed and stable Respiratory status: spontaneous breathing, nonlabored ventilation, respiratory function stable and patient connected to nasal cannula oxygen Cardiovascular status: blood pressure returned to baseline and stable Postop Assessment: no signs of nausea or vomiting Anesthetic complications: no    Last Vitals:  Filed Vitals:   05/01/16 1555 05/01/16 1617  BP: 114/66 110/65  Pulse: 52 64  Temp:  36.4 C  Resp: 12 18    Last Pain:  Filed Vitals:   05/01/16 1623  PainSc: 4                  Zenaida Deed

## 2016-05-02 ENCOUNTER — Encounter (HOSPITAL_COMMUNITY): Payer: Self-pay | Admitting: Orthopedic Surgery

## 2016-05-02 LAB — CBC
HCT: 35.6 % — ABNORMAL LOW (ref 39.0–52.0)
Hemoglobin: 11.5 g/dL — ABNORMAL LOW (ref 13.0–17.0)
MCH: 28.8 pg (ref 26.0–34.0)
MCHC: 32.3 g/dL (ref 30.0–36.0)
MCV: 89.2 fL (ref 78.0–100.0)
Platelets: 190 10*3/uL (ref 150–400)
RBC: 3.99 MIL/uL — ABNORMAL LOW (ref 4.22–5.81)
RDW: 13.4 % (ref 11.5–15.5)
WBC: 9.3 10*3/uL (ref 4.0–10.5)

## 2016-05-02 LAB — BASIC METABOLIC PANEL
Anion gap: 10 (ref 5–15)
BUN: 9 mg/dL (ref 6–20)
CO2: 27 mmol/L (ref 22–32)
Calcium: 8.8 mg/dL — ABNORMAL LOW (ref 8.9–10.3)
Chloride: 105 mmol/L (ref 101–111)
Creatinine, Ser: 1.13 mg/dL (ref 0.61–1.24)
GFR calc Af Amer: >60 mL/min (ref >60)
GFR calc non Af Amer: >60 mL/min (ref >60)
Glucose, Bld: 103 mg/dL — ABNORMAL HIGH (ref 65–99)
Potassium: 4 mmol/L (ref 3.5–5.1)
Sodium: 142 mmol/L (ref 135–145)

## 2016-05-02 LAB — TESTOSTERONE, FREE: Testosterone, Free: 8.3 pg/mL (ref 7.2–24.0)

## 2016-05-02 LAB — VITAMIN D 25 HYDROXY (VIT D DEFICIENCY, FRACTURES): Vit D, 25-Hydroxy: 67.7 ng/mL (ref 30.0–100.0)

## 2016-05-02 LAB — SEX HORMONE BINDING GLOBULIN: Sex Hormone Binding: 46.9 nmol/L (ref 19.3–76.4)

## 2016-05-02 LAB — CALCITRIOL (1,25 DI-OH VIT D): Vit D, 1,25-Dihydroxy: 66.8 pg/mL (ref 19.9–79.3)

## 2016-05-02 LAB — TESTOSTERONE: Testosterone: 327 ng/dL — ABNORMAL LOW (ref 348–1197)

## 2016-05-02 MED ORDER — TRAMADOL HCL 50 MG PO TABS: 50.0000 mg | ORAL_TABLET | Freq: Four times a day (QID) | ORAL | Status: DC | PRN

## 2016-05-02 MED ORDER — DOCUSATE SODIUM 100 MG PO CAPS: 100.0000 mg | ORAL_CAPSULE | Freq: Two times a day (BID) | ORAL | Status: DC

## 2016-05-02 MED ORDER — ENOXAPARIN SODIUM 40 MG/0.4ML ~~LOC~~ SOLN
40.0000 mg | SUBCUTANEOUS | Status: DC
  Administered 2016-05-03: 40 mg via SUBCUTANEOUS
  Filled 2016-05-02: qty 0.4

## 2016-05-02 MED ORDER — ENOXAPARIN SODIUM 30 MG/0.3ML ~~LOC~~ SOLN: 30.0000 mg | SUBCUTANEOUS | Status: DC

## 2016-05-02 MED ORDER — METHOCARBAMOL 500 MG PO TABS: 500.0000 mg | ORAL_TABLET | Freq: Four times a day (QID) | ORAL | Status: DC | PRN

## 2016-05-02 MED ORDER — TAPENTADOL HCL 50 MG PO TABS: 50.0000 mg | ORAL_TABLET | Freq: Four times a day (QID) | ORAL | Status: DC | PRN

## 2016-05-02 MED ORDER — OMEPRAZOLE 20 MG PO CPDR
40.0000 mg | DELAYED_RELEASE_CAPSULE | Freq: Every day | ORAL | Status: DC
  Administered 2016-05-02 – 2016-05-03 (×2): 40 mg via ORAL
  Filled 2016-05-02 (×2): qty 2

## 2016-05-02 MED ORDER — OXYCODONE HCL 5 MG PO TABS: 5.0000 mg | ORAL_TABLET | Freq: Four times a day (QID) | ORAL | Status: DC | PRN

## 2016-05-02 MED FILL — traMADol HCL 50 MG TABS: 50 | 12 days supply | Qty: 90 | Fill #0

## 2016-05-02 MED FILL — oxyCODONE HCL 5 MG TABS: 5 | 10 days supply | Qty: 80 | Fill #0

## 2016-05-02 MED FILL — NUCYNTA 50 MG TABLET: 50 | 12 days supply | Qty: 90 | Fill #0

## 2016-05-02 MED FILL — ENOXAPARIN 30 MG/0.3 ML SYR: 30 | 14 days supply | Qty: 4 | Fill #0

## 2016-05-02 MED FILL — METHOCARBAMOL 500 MG TABLET: 500 | 11 days supply | Qty: 90 | Fill #1

## 2016-05-02 NOTE — Evaluation (Signed)
Physical Therapy Evaluation Patient Details Name: Randy Bennett MRN: [id number] DOB: 08/29/1956 Today's Date: 05/02/2016   History of Present Illness  Pt is a 60 y/o male with a PMH significant for an open L distal tibia fracture in November 2016. Recent CT scan confirmed partial nonunion of this fracture. Pt also has a L ankle contracture. Pt is now s/p tibial IM nailing as well as achilles tendon lengthening on 05/01/16.  Clinical Impression  Pt admitted with above diagnosis. Pt currently with functional limitations due to the deficits listed below (see PT Problem List). At the time of PT eval pt was able to perform transfers and ambulation with overall modified independence. Pt reports he has been maintaining a NWB status for several months now and does not have any concerns with managing at home. Pt will benefit from skilled PT to increase their independence and safety with mobility to allow discharge to the venue listed below.       Follow Up Recommendations Outpatient PT;Supervision - Intermittent    Equipment Recommendations  None recommended by PT    Recommendations for Other Services       Precautions / Restrictions Precautions Precautions: Fall Restrictions Weight Bearing Restrictions: Yes LLE Weight Bearing: Non weight bearing      Mobility  Bed Mobility Overal bed mobility: Modified Independent             General bed mobility comments: HOB elevated. No assist required.   Transfers Overall transfer level: Modified independent Equipment used: Rolling walker (2 wheeled)             General transfer comment: Pt able to achieve full standing without assistance. No unsteadines or LOB noted and good maitenance of precautions.   Ambulation/Gait Ambulation/Gait assistance: Modified independent (Device/Increase time) Ambulation Distance (Feet): 15 Feet Assistive device: Rolling walker (2 wheeled) Gait Pattern/deviations: Step-to pattern;Decreased stride  length Gait velocity: Decreased Gait velocity interpretation: Below normal speed for age/gender General Gait Details: Pt able to manage NWB status well with RW. No difficulties ambulating around room.   Stairs            Wheelchair Mobility    Modified Rankin (Stroke Patients Only)       Balance Overall balance assessment: No apparent balance deficits (not formally assessed)                                           Pertinent Vitals/Pain Pain Assessment: 0-10 Pain Score: 2  Pain Location: L foot/ankle Pain Descriptors / Indicators: Operative site guarding;Sore Pain Intervention(s): Limited activity within patient's tolerance;Monitored during session;Repositioned    Home Living Family/patient expects to be discharged to:: Private residence Living Arrangements: Spouse/significant other Available Help at Discharge: Family;Available 24 hours/day Type of Home: House Home Access: Stairs to enter Entrance Stairs-Rails: None Entrance Stairs-Number of Steps: 2 Home Layout: One level Home Equipment: Bedside commode;Crutches;Walker - 2 wheels;Tub bench      Prior Function Level of Independence: Independent with assistive device(s)         Comments: Using the crutches at home.      Hand Dominance   Dominant Hand: Right    Extremity/Trunk Assessment   Upper Extremity Assessment: Defer to OT evaluation;Overall [facility] for tasks assessed           Lower Extremity Assessment: LLE deficits/detail   LLE Deficits / Details: Decreased strength and acute pain  consistent with above mentioned procedure.   Cervical / Trunk Assessment: Normal  Communication   Communication: No difficulties  Cognition Arousal/Alertness: Awake/alert Behavior During Therapy: WFL for tasks assessed/performed Overall Cognitive Status: Within Functional Limits for tasks assessed                      General Comments      Exercises        Assessment/Plan     PT Assessment Patient needs continued PT services  PT Diagnosis Difficulty walking;Acute pain   PT Problem List Decreased strength;Decreased range of motion;Decreased activity tolerance;Decreased balance;Decreased mobility;Decreased knowledge of use of DME;Decreased safety awareness;Decreased knowledge of precautions;Pain  PT Treatment Interventions DME instruction;Gait training;Stair training;Functional mobility training;Therapeutic activities;Therapeutic exercise;Neuromuscular re-education;Patient/family education   PT Goals (Current goals can be found in the Care Plan section) Acute Rehab PT Goals Patient Stated Goal: Home as soon as able PT Goal Formulation: With patient Time For Goal Achievement: 05/09/16 Potential to Achieve Goals: Good    Frequency Min 2X/week   Barriers to discharge        Co-evaluation               End of Session   Activity Tolerance: Patient tolerated treatment well Patient left: in chair;with call bell/phone within reach Nurse Communication: Mobility status         Time: [id number] PT Time Calculation (min) (ACUTE ONLY): 11 min   Charges:   PT Evaluation $PT Eval Moderate Complexity: 1 Procedure     PT G Codes:        Rolinda Roan 05/28/16, 4:21 PM  Rolinda Roan, PT, DPT Acute Rehabilitation Services Pager: [phone]

## 2016-05-02 NOTE — Progress Notes (Signed)
OT Cancellation Note  Patient Details Name: Randy Bennett MRN: [id number] DOB: August 09, 1956   Cancelled Treatment:    Reason Eval/Treat Not Completed: OT screened, no needs identified, will sign off  [facility], OTR/L  [id number] 05/02/2016 05/02/2016, 5:31 PM

## 2016-05-02 NOTE — Progress Notes (Signed)
Orthopaedic Trauma Service (OTS)  Subjective: 1 Day Post-Op Procedure(s) (LRB): LEFT ANKLE CAPSULOTOMY, LEFT ANKLE MANIPULATION (Left) LEFT ACHILLES TENDON LENGTHENING (Left) LEFT TIBIA REPAIR WITH AUTOGRAFTING, LEFT REAMED INTRAMEDULLARY ASPIRATION (Left) HARDWARE REMOVAL LEFT DISTAL TIBIA WITH REVISION OF FIXATION,  (Left) Patient reports pain as moderate, primarily from the hip; block has worn off.  Objective: Current Vitals Blood pressure 97/48, pulse 71, temperature 98 F (36.7 C), temperature source Oral, resp. rate 18, height 6' (1.829 m), weight 182 lb (82.555 kg), SpO2 100 %. Vital signs in last 24 hours: Temp:  [97.5 F (36.4 C)-98 F (36.7 C)] 98 F (36.7 C) (05/10 0500) Pulse Rate:  [52-100] 71 (05/10 0500) Resp:  [12-24] 18 (05/10 0500) BP: (92-132)/(48-97) 97/48 mmHg (05/10 0500) SpO2:  [97 %-100 %] 100 % (05/10 0500) Weight:  [182 lb (82.555 kg)] 182 lb (82.555 kg) (05/09 1020)  Intake/Output from previous day: 05/09 0701 - 05/10 0700 In: 1620 [P.O.:120; I.V.:1500] Out: 1350 [Urine:1200; Blood:150]  LABS  Recent Labs  05/01/16 0947 05/02/16 0310  HGB 14.8 11.5*    Recent Labs  05/01/16 0947 05/02/16 0310  WBC 8.5 9.3  RBC 5.04 3.99*  HCT 44.7 35.6*  PLT 239 190    Recent Labs  05/01/16 0947 05/02/16 0309  NA 139 142  K 4.1 4.0  CL 107 105  CO2 21* 27  BUN 13 9  CREATININE 1.00 1.13  GLUCOSE 105* 103*  CALCIUM 9.5 8.8*    Recent Labs  05/01/16 0947  INR 1.05   Physical Exam  LLE Dressing intact, but hip drainage, splint is clean, dry  Edema/ swelling controlled  Sens: DPN, SPN, TN intact grossly but decreased secondary to the block  Motor: EHL, FHL, and lessor toe ext and flex all intact grossly  Brisk cap refill, warm to touch   Imaging Dg Ankle Complete Left  05/01/2016  CLINICAL DATA:  Left ankle ORIF replaced, left femur aspiration Fluoro time was 50 seconds EXAM: LEFT ANKLE COMPLETE - 3+ VIEW; DG C-ARM 61-120 MIN  COMPARISON:  CT 04/05/2016 FINDINGS: 4 fluoroscopic spot images document revision of plate and screw fixation hardware across fracture of the distal tibia. Fracture fragments project in near anatomic alignment. Stable fracture deformity of the mid femoral shaft. IMPRESSION: 1. Revision of distal tibial fixation hardware as above. Electronically Signed   By: Lucrezia Europe M.D.   On: 05/01/2016 14:16   Dg Ankle Left Port  05/01/2016  CLINICAL DATA:  Type III tibia fracture with nonunion.  Postop. EXAM: PORTABLE LEFT ANKLE - 2 VIEW COMPARISON:  None. FINDINGS: Open reduction internal fixation of distal tibia fracture with plate and screw construct. Satisfactory position and alignment. Areas of lucency and sclerosis suggesting chronic infection. Healed distal fibular fracture. Soft tissue swelling. IMPRESSION: As above. Electronically Signed   By: Staci Righter M.D.   On: 05/01/2016 15:50   Dg C-arm 1-60 Min  05/01/2016  CLINICAL DATA:  Left ankle ORIF replaced, left femur aspiration Fluoro time was 50 seconds EXAM: LEFT ANKLE COMPLETE - 3+ VIEW; DG C-ARM 61-120 MIN COMPARISON:  CT 04/05/2016 FINDINGS: 4 fluoroscopic spot images document revision of plate and screw fixation hardware across fracture of the distal tibia. Fracture fragments project in near anatomic alignment. Stable fracture deformity of the mid femoral shaft. IMPRESSION: 1. Revision of distal tibial fixation hardware as above. Electronically Signed   By: Lucrezia Europe M.D.   On: 05/01/2016 14:16   Dg Femur Min 2 Views Left  05/01/2016  CLINICAL DATA:  Left ankle ORIF replaced, left femur aspiration Fluoro time was 50 seconds EXAM: LEFT FEMUR 2 VIEWS COMPARISON:  None. FINDINGS: 3 intraoperative fluoroscopic spot images document placement of a left femur IM rod, distal aspect not visualized. IMPRESSION: Left femoral IM rod placement. Electronically Signed   By: Lucrezia Europe M.D.   On: 05/01/2016 14:17    Assessment/Plan: 1 Day Post-Op Procedure(s)  (LRB): LEFT ANKLE CAPSULOTOMY, LEFT ANKLE MANIPULATION (Left) LEFT ACHILLES TENDON LENGTHENING (Left) LEFT TIBIA REPAIR WITH AUTOGRAFTING, LEFT REAMED INTRAMEDULLARY ASPIRATION (Left) HARDWARE REMOVAL LEFT DISTAL TIBIA WITH REVISION OF FIXATION,  (Left)  1. PT/OT NWB LLE 2. Change dressing tomorrow am 3. Convert to Nucynta and po meds, d/c ivf and pca 4. D/c to home tomorrow  Altamese [place], MD [facility] [phone] (p)   05/02/2016, 8:41 AM

## 2016-05-02 NOTE — Brief Op Note (Addendum)
05/01/2016  8:37 AM  PATIENT:  Cleatis Polka  60 y.o. male  PRE-OPERATIVE DIAGNOSIS:  LEFT TIBIA NON UNION  POST-OPERATIVE DIAGNOSIS:  LEFT TIBIA NON UNION  PROCEDURE:  Procedure(s): LEFT ANKLE CAPSULOTOMY, LEFT ANKLE MANIPULATION (Left) LEFT ACHILLES TENDON LENGTHENING (Left) LEFT TIBIA REPAIR WITH AUTOGRAFTING, LEFT REAMED INTRAMEDULLARY ASPIRATION (Left) HARDWARE REMOVAL LEFT DISTAL TIBIA WITH REVISION OF FIXATION,  (Left)  SURGEON:  Surgeon(s) and Role:    * Altamese [place], MD - Primary  PHYSICIAN ASSISTANT: Ainsley Spinner, PA-C  ANESTHESIA:   general  I/O:  Total I/O In: 360 [P.O.:360] Out: -   SPECIMEN:  Source of Specimen:  tibial nonunion site  TOURNIQUET:   Total Tourniquet Time Documented: Thigh (Left) - 81 minutes Total: Thigh (Left) - 81 minutes   DICTATION: [id number]

## 2016-05-02 NOTE — Op Note (Signed)
NAMEMarland Kitchen  Randy Bennett, [name]            ACCOUNT NO.:  [id number]  MEDICAL RECORD NO.:  [id number]  LOCATION:  [id number]                        FACILITY:  [facility]  PHYSICIAN:  Naquan Shrestha. Marcelino Scot, M.D. DATE OF BIRTH:  07-26-1956  DATE OF PROCEDURE:  05/01/2016 DATE OF DISCHARGE:                              OPERATIVE REPORT   PREOPERATIVE DIAGNOSES: 1. Left tibial pilon nonunion. 2. Left ankle contracture.  POSTOPERATIVE DIAGNOSES: 1. Left tibial pilon nonunion. 2. Left ankle contracture.  PROCEDURES: 1. Repair of left tibia nonunion using reamed intramedullary aspirate. 2. Left ankle capsulectomy. 3. Left Achilles tendon lengthening.  SURGEON:  [place] Adelson. Marcelino Scot, M.D.  ASSISTANT:  Ainsley Spinner, PA-C.  ANESTHESIA:  General.  TOTAL TOURNIQUET TIME:  Approximately, 75 minutes.  DISPOSITION:  To PACU.  CONDITION:  Stable.  BRIEF SUMMARY AND INDICATIONS FOR PROCEDURE:  Randy Bennett is a 60- year-old male, status post open left pilon fracture, treated with ORIF. He went on to develop a nonunion, but did not develop any clinical evidence of infection.  I discussed with him and his wife the risks and benefits of surgical repair at this time including just perform a grafting versus hardware exchange to provide a fresh set of cycles.  We also discussed capsulotomy and manipulation of the ankle as opposed to a tendo Achilles lengthening.  We also discussed evaluation of the fibula fracture site.  Risks discussed included, persistent nonunion, infection, nerve injury, vessel injury, DVT, PE, recurrence of contracture, and need for further surgery among others.  We did also discuss the potential complications of reamed intramedullary aspiration including femur fracture.  BRIEF SUMMARY OF PROCEDURE:  The patient was taken to the operating room.  Antibiotics were held.  General anesthesia was induced.  His left lower extremity was prepped and draped in usual sterile fashion.  We made the old  anterior incision and carefully carried dissection down over the superficial peroneal nerve, which was protected with the extensors laterally.  I identified the plate proximally, and during careful elevation with an elevator, we did encounter some bleeding of the dorsalis pedis artery.  Part of this actually jogged into one of the holes of the plate and during mobilization, sustained a small caliber tear, which was repaired with a 6-0 Prolene without incident.  The neurovascular bundle was mobilized at the joint, but again was tethered down directly over the titanium plate.  Distally and laterally, I did go to the lateral side of the superficial peroneal nerve and extensors and identified the washer, which was removed without complication.  After this, I was also able to perform a complete anterior capsulotomy under direct visualization.  Anteriorly, I identified the nonunion site which had packed bone graft within it, but no incorporation of same.  This was debrided out and thoroughly irrigated.  I did send some of this material for culture, and the patient immediately received Ancef afterward.  A 2 cm incision was then made at the hip, and the 13 mm reamer aspirator selected, guidewire and then starting hole was created in the proximal femur, which was followed by the reamer aspirator all the way down to the distal femur obtaining about 40 mL of graft.  This fit very  nicely into the recipient site distally.  I did bend off the tabs for the Biomet plate and placed 4 lock screws distally and 3 standard screws proximally to provide additional stability.  We did identify some slight motion through the medial side where his open wound had been, mobilized the soft tissues very carefully as well.  Dissection was difficult secondary to the extensive scarring throughout the wound bed.  The joint surface on the talus appeared to be healthy.  After irrigation and closure of both wounds, attention  was turned to the ankle where manipulation was performed.  I was unable to really generate much in the way of additional extension, consequently 3 half width incisions of the Achilles were made with a 10 blade, 2 medially and 1 laterally and then manipulation repeated with excellent control spread in the ankle extension.  The patient was then splinted, and this extended position after closure of the wounds with 3-0 nylon.  Ainsley Spinner, PA-C assisted me throughout, and his assistance was absolutely necessary for the safe and effective completion including both repair of the nonunion as well as obtaining major bone graft from the left femur.  I also helped to control leg manipulation and capsulotomy.  PROGNOSIS:  Randy Bennett will be non-weightbearing given his slight motion at the fracture site.  We would anticipate resuming weightbearing at 6 weeks and possibly some partial weightbearing at 4 weeks. Unrestricted range of motion immediately upon removal of the splint in 2 weeks, and overnight admission is anticipated with possible several days of stay depending upon our ability to control his pain, which has been an issue in the past.  He will be on formal pharmacologic DVT prophylaxis with Lovenox.  He certainly had elevated risk for occult infection given his open fracture as well as persistent nonunion.     Astrid Divine. Marcelino Scot, M.D.     MHH/MEDQ  D:  05/01/2016  T:  05/02/2016  Job:  [id number]

## 2016-05-02 NOTE — Progress Notes (Signed)
Orthopedic Tech Progress Note Patient Details:  Randy Bennett Aug 03, 1956 [id number]  Patient ID: Randy Bennett, male   DOB: 16-Nov-1956, 60 y.o.   MRN: [id number] Applied ohf  Karolee Stamps 05/02/2016, 9:34 PM

## 2016-05-03 ENCOUNTER — Encounter (HOSPITAL_COMMUNITY): Payer: Self-pay | Admitting: Orthopedic Surgery

## 2016-05-03 DIAGNOSIS — M6702 Short Achilles tendon (acquired), left ankle: Secondary | ICD-10-CM | POA: Diagnosis present

## 2016-05-03 DIAGNOSIS — M24572 Contracture, left ankle: Secondary | ICD-10-CM | POA: Diagnosis present

## 2016-05-03 HISTORY — DX: Short Achilles tendon (acquired), left ankle: M67.02

## 2016-05-03 HISTORY — DX: Contracture, left ankle: M24.572

## 2016-05-03 LAB — BASIC METABOLIC PANEL
Anion gap: 9 (ref 5–15)
BUN: 9 mg/dL (ref 6–20)
CO2: 28 mmol/L (ref 22–32)
Calcium: 9 mg/dL (ref 8.9–10.3)
Chloride: 101 mmol/L (ref 101–111)
Creatinine, Ser: 1.23 mg/dL (ref 0.61–1.24)
GFR calc Af Amer: >60 mL/min (ref >60)
GFR calc non Af Amer: >60 mL/min (ref >60)
Glucose, Bld: 95 mg/dL (ref 65–99)
Potassium: 3.7 mmol/L (ref 3.5–5.1)
Sodium: 138 mmol/L (ref 135–145)

## 2016-05-03 LAB — VITAMIN A: Vitamin A (Retinoic Acid): 64 ug/dL (ref 24–85)

## 2016-05-03 MED ORDER — FLUCONAZOLE 150 MG PO TABS
150.0000 mg | ORAL_TABLET | Freq: Every day | ORAL | Status: DC
Start: 2016-05-03 — End: 2018-07-18

## 2016-05-03 MED ORDER — CHOLECALCIFEROL 50 MCG (2000 UT) PO TABS: 2000.0000 [IU] | ORAL_TABLET | Freq: Every day | ORAL | Status: DC

## 2016-05-03 MED ORDER — VITAMIN D 1000 UNITS PO TABS: 2000.0000 [IU] | ORAL_TABLET | Freq: Every day | ORAL | Status: DC

## 2016-05-03 NOTE — Progress Notes (Signed)
Physical Therapy Treatment Patient Details Name: Randy Bennett MRN: [id number] DOB: 01-01-1956 Today's Date: 05/03/2016    History of Present Illness Pt is a 60 y/o male with a PMH significant for an open L distal tibia fracture in November 2016. Recent CT scan confirmed partial nonunion of this fracture. Pt also has a L ankle contracture. Pt is now s/p tibial IM nailing as well as achilles tendon lengthening on 05/01/16.    PT Comments    Patient is making good progress with PT.  From a mobility standpoint anticipate patient will be ready for DC home with family support. Patient denies any questions or concerns.       Follow Up Recommendations  Outpatient PT;Supervision - Intermittent     Equipment Recommendations  None recommended by PT    Recommendations for Other Services       Precautions / Restrictions Precautions Precautions: Fall Restrictions Weight Bearing Restrictions: Yes LLE Weight Bearing: Non weight bearing    Mobility  Bed Mobility Overal bed mobility: Modified Independent             General bed mobility comments: sit to supine  Transfers Overall transfer level: Modified independent Equipment used: Rolling walker (2 wheeled)             General transfer comment: pt with safe technique  Ambulation/Gait Ambulation/Gait assistance: Modified independent (Device/Increase time) Ambulation Distance (Feet): 150 Feet Assistive device: Rolling walker (2 wheeled) Gait Pattern/deviations:  (swing-to pattern) Gait velocity: Decreased   General Gait Details: Stable pattern, consistent with NWB.   Stairs Stairs: Yes Stairs assistance: Supervision Stair Management: No rails;Backwards;With walker Number of Stairs: 1 General stair comments: Pt reports that he only has one step from the side of his house. Reports feeling confident after attempt.   Wheelchair Mobility    Modified Rankin (Stroke Patients Only)       Balance Overall balance  assessment: No apparent balance deficits (not formally assessed)                                  Cognition Arousal/Alertness: Awake/alert Behavior During Therapy: WFL for tasks assessed/performed Overall Cognitive Status: Within Functional Limits for tasks assessed                      Exercises      General Comments        Pertinent Vitals/Pain Pain Assessment: 0-10 Pain Score: 2  Pain Location: Lt leg Pain Descriptors / Indicators: Sore Pain Intervention(s): Limited activity within patient's tolerance;Monitored during session    Home Living                      Prior Function            PT Goals (current goals can now be found in the care plan section) Acute Rehab PT Goals Patient Stated Goal: Home as soon as able PT Goal Formulation: With patient Time For Goal Achievement: 05/09/16 Potential to Achieve Goals: Good Progress towards PT goals: Progressing toward goals    Frequency  Min 2X/week    PT Plan Current plan remains appropriate    Co-evaluation             End of Session Equipment Utilized During Treatment: Gait belt Activity Tolerance: Patient tolerated treatment well Patient left: in bed;with call bell/phone within reach     Time: 1202-1212 PT Time Calculation (  min) (ACUTE ONLY): 10 min  Charges:  $Gait Training: 8-22 mins                    G Codes:      Cassell Clement, PT, CSCS Pager [phone] Office [phone]  05/03/2016, 12:51 PM

## 2016-05-03 NOTE — Progress Notes (Signed)
Pt ready for discharge. Education/instructions reviewed with pt and wife and all questions/concerns addressed. IV removed and belongings gathered. Pt will be transported out via wheelchair to wife's car. Will continue to monitor

## 2016-05-03 NOTE — Progress Notes (Signed)
Orthopaedic Trauma Service Progress Note  Subjective  Doing well this am L leg is sore  Anterior L ankle very sore  Mobilizing well independently  States mouth was a little sore yesterday but better this am   Review of Systems  Constitutional: Negative for fever and chills.  Eyes: Negative for blurred vision and double vision.  Respiratory: Negative for shortness of breath and wheezing.   Cardiovascular: Negative for chest pain and palpitations.  Gastrointestinal: Negative for nausea and vomiting.  Genitourinary: Negative for dysuria.  Neurological: Negative for tingling, sensory change and headaches.     Objective   BP 115/48 mmHg  Pulse 75  Temp(Src) 97.3 F (36.3 C) (Oral)  Resp 17  Ht 6' (1.829 m)  Wt 82.555 kg (182 lb)  BMI 24.68 kg/m2  SpO2 100%  Intake/Output      05/10 0701 - 05/11 0700 05/11 0701 - 05/12 0700   P.O. 1320 240   I.V. (mL/kg)     Total Intake(mL/kg) 1320 (16) 240 (2.9)   Urine (mL/kg/hr) 550 (0.3)    Blood     Total Output 550     Net +770 +240        Urine Occurrence 2 x      Labs  Results for WEBBER, MICHIELS (MRN [id number]) as of 05/03/2016 11:52  Ref. Range 05/03/2016 04:11  Sodium Latest Ref Range: 135-145 mmol/L 138  Potassium Latest Ref Range: 3.5-5.1 mmol/L 3.7  Chloride Latest Ref Range: 101-111 mmol/L 101  CO2 Latest Ref Range: 22-32 mmol/L 28  BUN Latest Ref Range: 6-20 mg/dL 9  Creatinine Latest Ref Range: 0.61-1.24 mg/dL 1.23  Calcium Latest Ref Range: 8.9-10.3 mg/dL 9.0  EGFR (Non-African Amer.) Latest Ref Range: >60 mL/min >60  EGFR (African American) Latest Ref Range: >60 mL/min >60  Glucose Latest Ref Range: 65-99 mg/dL 95  Anion gap Latest Ref Range: 5-15  9  Results for HARREL, FERRONE (MRN [id number]) as of 05/03/2016 11:52  Ref. Range 05/01/2016 09:47  Alkaline Phosphatase Latest Ref Range: 38-126 U/L 53  Albumin Latest Ref Range: 3.5-5.0 g/dL 4.0  AST Latest Ref Range: 15-41 U/L 25  ALT Latest Ref Range:  17-63 U/L 28  Total Protein Latest Ref Range: 6.5-8.1 g/dL 7.0  Total Bilirubin Latest Ref Range: 0.3-1.2 mg/dL 1.6 (H)  CRP Latest Ref Range: <1.0 mg/dL <0.5  Vit D, 1,25-Dihydroxy Latest Ref Range: 19.9-79.3 pg/mL 66.8  Vitamin D, 25-Hydroxy Latest Ref Range: 30.0-100.0 ng/mL 67.7  Vitamin A (Retinoic Acid) Latest Ref Range: 24-85 ug/dL 64   Results for BRENON, ANTOSH (MRN [id number]) as of 05/03/2016 11:52  Ref. Range 05/01/2016 09:47  Sed Rate Latest Ref Range: 0-16 mm/hr 3  Results for QUINCEY, QUESINBERRY (MRN [id number]) as of 05/03/2016 11:52  Ref. Range 05/01/2016 09:47  Sex Horm Binding Glob, Serum Latest Ref Range: 19.3-76.4 nmol/L 46.9  Testosterone Latest Ref Range: 517-225-1272 ng/dL 327 (L)  Testosterone Free Latest Ref Range: 7.2-24.0 pg/mL 8.3   Exam  Gen: awake and alert, sitting in bedside chair, NAD, good spirits  Mouth: no ulcers, lesion, no appearance of thrush  Lungs: breathing unlabored  Cardiac: regular Ext:       Left Lower extremity   Dressing L hip soiled but drainage dried  Splint fitting well  Swelling controlled  DPN, SPN, TN sensation grossly intact  EHL, FHL, lesser toe motor functions intact  Ext warm  Brisk cap refill  + DP pulse    Assessment and Plan  POD/HD#: 2  60 y/o male s/p repair L distal tibia nonunion and L achilles tendon lengthening    -L distal tibia nonunion s/p repair with RIA and hardware exchange   NWB x 6 weeks  Splint x 2 weeks then unrestricted ROM    Ice and elevate  Activity as tolerated o/w  -L achilles tendon lengthening  As above   - Pain management:  Nucynta, tramadol, oxy IR  - Hemodynamics  Stable   Wife/family inquired about Iron supplementation- do not feel pt needs. Eats adequate animal products including meat.   Do not think pt would benefit and may pose more risk   - Medical issues   GERD.HTN   Home meds  - DVT/PE prophylaxis:  Dc home with lovenox x 14 days - ID:   Completed periop abx  Pt  with hx of oral thrush post IV abx  Some sx yesterday, today seems improved  Will send home with Rx for diflucan only to be filled if symptoms persist   - Metabolic Bone Disease:  Hx of vitamin D insufficiency    Resolved   25 OH vitamin D > 60    Calcitriol levels appropriate as well       Dc vitamin d2 50,000 IUs weekly   Vitamin d3 2000 IUs daily   Recheck vitamin d levels after summer (sept/oct)  - Activity:  NWB L leg o/w as tolerated  - FEN/GI prophylaxis/Foley/Lines:  protonix  Diet as tolerated   - Dispo:  Dc home today  Follow up in 2 weeks     Jari Pigg, PA-C [facility] [phone]) 05/03/2016 11:51 AM

## 2016-05-03 NOTE — Discharge Summary (Signed)
Orthopaedic Trauma Service (OTS)  Patient ID: Randy Bennett MRN: [id number] DOB/AGE: 1956/11/23 60 y.o.  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 Marcelino Scot  1. Repair of left tibia nonunion using reamed intramedullary aspirate. 2. Left ankle capsulectomy. 3. Left Achilles tendon lengthening   Discharged Condition: good  Hospital Course:   60 year old male well-known to the orthopedic trauma service after sustaining an open left distal tibia fracture involving his plafond as well as fibula. Patient underwent period of external fixation followed by ORIF of his left tibia. Patient unfortunately developed a symptomatic nonunion of his left distal tibia metaphysis. Patient was taken to the OR on the date noted above for the procedures noted above. After surgery he was admitted for observation and pain control and therapies. We did take reamed intramedullary aspirate from his left femur for autograft. Patient's hospital stay was uncomplicated. He mobilized well with physical therapy on postoperative day #1. No issues with voiding or diet. Patient was deemed stable for discharge on postoperative day #2. Patient discharged in stable condition to home.  On admission patient's metabolic bone labs were checked once again he had excellent rise in his 25-hydroxy vitamin D levels. We will discontinue his vitamin D2 supplementation and start vitamin D3 supplementation. I would recommend rechecking his labs at the end of the summer. Additionally other labs were within acceptable limits. His free testosterone was normal. He did have a slight dip in his  total testosterone level but this could likely be attributed to his prolonged opioid use. Do not feel is clinically warranted to check additional labs such as LH and FSH. Could recheck his testosterone once he is off all of his narcotics. Overall patient appears to be doing very well from a metabolic standpoint.  Consults: None  Significant Diagnostic Studies: labs:  Results for CHEZ, BULNES (MRN [id number]) as of 05/03/2016 12:29  Ref. Range 05/02/2016 03:10 05/03/2016 04:11  Sodium Latest Ref Range: 135-145 mmol/L  138  Potassium Latest Ref Range: 3.5-5.1 mmol/L  3.7  Chloride Latest Ref Range: 101-111 mmol/L  101  CO2 Latest Ref Range: 22-32 mmol/L  28  BUN Latest Ref Range: 6-20 mg/dL  9  Creatinine Latest Ref Range: 0.61-1.24 mg/dL  1.23  Calcium Latest Ref Range: 8.9-10.3 mg/dL  9.0  EGFR (Non-African Amer.) Latest Ref Range: >60 mL/min  >60  EGFR (African American) Latest Ref Range: >60 mL/min  >60  Glucose Latest Ref Range: 65-99 mg/dL  95  Anion gap Latest Ref Range: 5-15   9  WBC Latest Ref Range: 4.0-10.5 K/uL 9.3   RBC Latest Ref Range: 4.22-5.81 MIL/uL 3.99 (L)   Hemoglobin Latest Ref Range: 13.0-17.0 g/dL 11.5 (L)   HCT Latest Ref Range: 39.0-52.0 % 35.6 (L)   MCV Latest Ref Range: 78.0-100.0 fL 89.2   MCH Latest Ref Range: 26.0-34.0 pg 28.8   MCHC Latest Ref Range: 30.0-36.0 g/dL 32.3   RDW Latest Ref Range: 11.5-15.5 % 13.4   Platelets Latest Ref Range: 150-400 K/uL 190    Results for TORRE, SCHAUMBURG (MRN [id number]) as of 05/03/2016 12:29  Ref. Range 05/01/2016 09:47  CRP Latest Ref Range: <1.0 mg/dL <0.5  Vit D,  1,25-Dihydroxy Latest Ref Range: 19.9-79.3 pg/mL 66.8  Vitamin D, 25-Hydroxy Latest Ref Range: 30.0-100.0 ng/mL 67.7  Vitamin A (Retinoic Acid) Latest Ref Range: 24-85 ug/dL 64   Results for KASH, DAVIE (MRN [id number]) as of 05/03/2016 12:29  Ref. Range 05/01/2016 09:47  Alkaline Phosphatase Latest Ref Range: 38-126 U/L 53  Albumin Latest Ref  Range: 3.5-5.0 g/dL 4.0  AST Latest Ref Range: 15-41 U/L 25  ALT Latest Ref Range: 17-63 U/L 28  Total Protein Latest Ref Range: 6.5-8.1 g/dL 7.0  Total Bilirubin Latest Ref Range: 0.3-1.2 mg/dL 1.6 (H)  Results for BALLARD, BUDNEY (MRN [id number]) as of 05/03/2016 12:29  Ref. Range 05/01/2016 09:47  Sed Rate Latest Ref Range: 0-16 mm/hr 3  Results for SALEH, ULBRICH (MRN [id number]) as of 05/03/2016 12:29  Ref. Range 05/01/2016 09:47  Sex Horm Binding Glob, Serum Latest Ref Range: 19.3-76.4 nmol/L 46.9  Testosterone Latest Ref Range: 610 781 2890 ng/dL 327 (L)  Testosterone Free Latest Ref Range: 7.2-24.0 pg/mL 8.3   Treatments: IV hydration, antibiotics: Ancef, analgesia: Nucynta, OxyIR, tramadol, anticoagulation: LMW heparin, therapies: PT, OT and RN and surgery: As above  Discharge Exam:     Orthopaedic Trauma Service Progress Note  Subjective  Doing well this am L leg is sore   Anterior L ankle very sore   Mobilizing well independently   States mouth was a little sore yesterday but better this am   Review of Systems  Constitutional: Negative for fever and chills.  Eyes: Negative for blurred vision and double vision.  Respiratory: Negative for shortness of breath and wheezing.   Cardiovascular: Negative for chest pain and palpitations.  Gastrointestinal: Negative for nausea and vomiting.  Genitourinary: Negative for dysuria.  Neurological: Negative for tingling, sensory change and headaches.     Objective   BP 115/48 mmHg  Pulse 75  Temp(Src) 97.3 F (36.3 C) (Oral)  Resp 17  Ht 6' (1.829 m)  Wt 82.555 kg (182 lb)  BMI 24.68 kg/m2  SpO2 100%  Intake/Output       05/10 0701 - 05/11 0700 05/11 0701 - 05/12 0700    P.O. 1320 240    I.V. (mL/kg)      Total Intake(mL/kg) 1320 (16) 240 (2.9)    Urine (mL/kg/hr) 550 (0.3)     Blood      Total Output 550      Net +770 +240          Urine Occurrence 2 x       Labs  Results for TUSHAR, ENNS (MRN  [id number]) as of 05/03/2016 11:52   Ref. Range  05/03/2016 04:11   Sodium  Latest Ref Range: 135-145 mmol/L  138   Potassium  Latest Ref Range: 3.5-5.1 mmol/L  3.7   Chloride  Latest Ref Range: 101-111 mmol/L  101   CO2  Latest Ref Range: 22-32 mmol/L  28   BUN  Latest Ref Range: 6-20 mg/dL  9   Creatinine  Latest Ref Range: 0.61-1.24 mg/dL  1.23   Calcium  Latest Ref Range: 8.9-10.3 mg/dL  9.0   EGFR (Non-African Amer.)  Latest Ref Range: >60 mL/min  >60   EGFR (African American)  Latest Ref Range: >60 mL/min  >60   Glucose  Latest Ref Range: 65-99 mg/dL  95   Anion gap  Latest Ref Range: 5-15   9   Results for DAVARIUS, RIDENER (MRN [id number]) as of 05/03/2016 11:52   Ref. Range  05/01/2016 09:47  Alkaline Phosphatase  Latest Ref Range: 38-126 U/L  53   Albumin  Latest Ref Range: 3.5-5.0 g/dL  4.0   AST  Latest Ref Range: 15-41 U/L  25   ALT  Latest Ref Range: 17-63 U/L  28   Total Protein  Latest Ref Range: 6.5-8.1 g/dL  7.0   Total Bilirubin  Latest Ref Range: 0.3-1.2 mg/dL  1.6 (H)   CRP  Latest Ref Range: <1.0 mg/dL  <0.5   Vit D, 1,25-Dihydroxy  Latest Ref Range: 19.9-79.3 pg/mL  66.8   Vitamin D, 25-Hydroxy  Latest Ref Range: 30.0-100.0 ng/mL  67.7   Vitamin A (Retinoic Acid)  Latest Ref Range: 24-85 ug/dL  64    Results for CHRISTOPER, BUSHEY (MRN [id number]) as of 05/03/2016 11:52   Ref. Range  05/01/2016 09:47   Sed Rate  Latest Ref Range: 0-16 mm/hr  3   Results for RIYAAN, HEROUX (MRN [id number]) as of 05/03/2016 11:52   Ref. Range  05/01/2016 09:47   Sex Horm Binding Glob, Serum  Latest Ref Range: 19.3-76.4 nmol/L  46.9   Testosterone  Latest Ref Range: 226-282-3553 ng/dL  327 (L)   Testosterone Free  Latest Ref Range: 7.2-24.0 pg/mL  8.3    Exam  Gen: awake and alert, sitting in bedside chair, NAD, good spirits   Mouth: no ulcers, lesion, no appearance of thrush   Lungs: breathing unlabored   Cardiac: regular Ext:        Left Lower extremity               Dressing L  hip soiled but drainage dried             Splint fitting well             Swelling controlled             DPN, SPN, TN sensation grossly intact             EHL, FHL, lesser toe motor functions intact             Ext warm             Brisk cap refill             + DP pulse     Assessment and Plan    POD/HD#: 2 60 y/o male s/p repair L distal tibia nonunion and L achilles tendon lengthening     -L distal tibia nonunion s/p repair with RIA and hardware exchange               NWB x 6 weeks             Splint x 2 weeks then unrestricted ROM                     Ice and elevate             Activity as tolerated o/w  -L achilles tendon lengthening             As above   - Pain management:             Nucynta, tramadol, oxy IR  - Hemodynamics             Stable               Wife/family inquired about Iron supplementation- do not feel pt needs. Eats adequate animal products including meat.  Do not think pt would benefit and may pose more risk   - Medical issues               GERD.HTN                         Home meds  - DVT/PE prophylaxis:             Dc home with lovenox x 14 days - ID:               Completed periop abx             Pt with hx of oral thrush post IV abx             Some sx yesterday, today seems improved             Will send home with Rx for diflucan only to be filled if symptoms persist   - Metabolic Bone Disease:             Hx of vitamin D insufficiency                           Resolved                         25 OH vitamin D > 60                           Calcitriol levels appropriate as well                                                    Dc vitamin d2 50,000 IUs weekly                         Vitamin d3 2000 IUs daily                         Recheck vitamin d levels after summer (sept/oct)  - Activity:             NWB L leg o/w as tolerated  - FEN/GI prophylaxis/Foley/Lines:             protonix             Diet as tolerated    - Dispo:             Dc home today             Follow up in 2 weeks      Disposition: 01-Home or Self Care  Discharge Instructions    Call MD / Call 911    Complete by:  As directed   If you experience chest pain or shortness of breath, CALL 911 and be transported to the hospital emergency room.  If you develope a fever above 101 F, pus (white drainage) or increased drainage or redness at the wound, or calf pain, call your surgeon's office.     Constipation Prevention    Complete by:  As directed   Drink plenty of fluids.  Prune juice may be helpful.  You may use a stool softener, such as Colace (over the counter) 100 mg  twice a day.  Use MiraLax (over the counter) for constipation as needed.     Diet general    Complete by:  As directed      Discharge instructions    Complete by:  As directed   Orthopaedic Trauma Service Discharge Instructions   General Discharge Instructions  WEIGHT BEARING STATUS: Nonweightbearing Left Leg   RANGE OF MOTION/ACTIVITY: range of motion as tolerated Left knee and hip  Wound Care: keep splint clean and dry. Ok to wash L hip wound with soap and water. Can leave uncovered once drainage stops   PAIN MEDICATION USE AND EXPECTATIONS  You have likely been given narcotic medications to help control your pain.  After a traumatic event that results in an fracture (broken bone) with or without surgery, it is ok to use narcotic pain medications to help control one's pain.  We understand that everyone responds to pain differently and each individual patient will be evaluated on a regular basis for the continued need for narcotic medications. Ideally, narcotic medication use should last no more than 6-8 weeks (coinciding with fracture healing).   As a patient it is your responsibility as well to monitor narcotic medication use and report the amount and frequency you use these medications when you come to your office visit.   We would also advise that if you are  using narcotic medications, you should take a dose prior to therapy to maximize you participation.  IF YOU ARE ON NARCOTIC MEDICATIONS IT IS NOT PERMISSIBLE TO OPERATE A MOTOR VEHICLE (MOTORCYCLE/CAR/TRUCK/MOPED) OR HEAVY MACHINERY DO NOT MIX NARCOTICS WITH OTHER CNS (CENTRAL NERVOUS SYSTEM) DEPRESSANTS SUCH AS ALCOHOL  Diet: as you were eating previously.  Can use over the counter stool softeners and bowel preparations, such as Miralax, to help with bowel movements.  Narcotics can be constipating.  Be sure to drink plenty of fluids    STOP SMOKING OR USING NICOTINE PRODUCTS!!!!  As discussed nicotine severely impairs your body's ability to heal surgical and traumatic wounds but also impairs bone healing.  Wounds and bone heal by forming microscopic blood vessels (angiogenesis) and nicotine is a vasoconstrictor (essentially, shrinks blood vessels).  Therefore, if vasoconstriction occurs to these microscopic blood vessels they essentially disappear and are unable to deliver necessary nutrients to the healing tissue.  This is one modifiable factor that you can do to dramatically increase your chances of healing your injury.    (This means no smoking, no nicotine gum, patches, etc)  DO NOT USE NONSTEROIDAL ANTI-INFLAMMATORY DRUGS (NSAID'S)  Using products such as Advil (ibuprofen), Aleve (naproxen), Motrin (ibuprofen) for additional pain control during fracture healing can delay and/or prevent the healing response.  If you would like to take over the counter (OTC) medication, Tylenol (acetaminophen) is ok.  However, some narcotic medications that are given for pain control contain acetaminophen as well. Therefore, you should not exceed more than 4000 mg of tylenol in a day if you do not have liver disease.  Also note that there are may OTC medicines, such as cold medicines and allergy medicines that my contain tylenol as well.  If you have any questions about medications and/or interactions please ask  your doctor/PA or your pharmacist.      ICE AND ELEVATE INJURED/OPERATIVE EXTREMITY  Using ice and elevating the injured extremity above your heart can help with swelling and pain control.  Icing in a pulsatile fashion, such as 20 minutes on and 20 minutes off, can be followed.  Do not place ice directly on skin. Make sure there is a barrier between to skin and the ice pack.    Using frozen items such as frozen peas works well as the conform nicely to the are that needs to be iced.  USE AN ACE WRAP OR TED HOSE FOR SWELLING CONTROL  In addition to icing and elevation, Ace wraps or TED hose are used to help limit and resolve swelling.  It is recommended to use Ace wraps or TED hose until you are informed to stop.    When using Ace Wraps start the wrapping distally (farthest away from the body) and wrap proximally (closer to the body)   Example: If you had surgery on your leg or thing and you do not have a splint on, start the ace wrap at the toes and work your way up to the thigh        If you had surgery on your upper extremity and do not have a splint on, start the ace wrap at your fingers and work your way up to the upper arm  IF YOU ARE IN A SPLINT OR CAST DO NOT [place]   If your splint gets wet for any reason please contact the office immediately. You may shower in your splint or cast as long as you keep it dry.  This can be done by wrapping in a cast cover or garbage back (or similar)  Do Not stick any thing down your splint or cast such as pencils, money, or hangers to try and scratch yourself with.  If you feel itchy take benadryl as prescribed on the bottle for itching  IF YOU ARE IN A CAM BOOT (BLACK BOOT)  You may remove boot periodically. Perform daily dressing changes as noted below.  Wash the liner of the boot regularly and wear a sock when wearing the boot. It is recommended that you sleep in the boot until told otherwise  CALL THE OFFICE WITH ANY QUESTIONS OR  CONCERTS: [phone]     Driving restrictions    Complete by:  As directed   No driving     Increase activity slowly as tolerated    Complete by:  As directed      Non weight bearing    Complete by:  As directed   Laterality:  left  Extremity:  Lower            Medication List    STOP taking these medications        HYDROmorphone 2 MG tablet  Commonly known as:  DILAUDID     Vitamin D (Ergocalciferol) 50000 units Caps capsule  Commonly known as:  DRISDOL      TAKE these medications        atorvastatin 20 MG tablet  Commonly known as:  LIPITOR  Take 20 mg by mouth daily at 6 PM.     atorvastatin 10 MG tablet  Commonly known as:  LIPITOR  Take 10 mg by mouth daily at 6 PM.     cetirizine 10 MG tablet  Commonly known as:  ZYRTEC  Take 10 mg by mouth daily.     Cholecalciferol 2000 units Tabs  Take 1 tablet (2,000 Units total) by mouth daily.     diazepam 5 MG tablet  Commonly known as:  VALIUM  Take 5 mg by mouth daily as needed for anxiety.     docusate sodium 100 MG capsule  Commonly known as:  COLACE  Take 1 capsule (100 mg total) by mouth 2 (two) times daily.     enoxaparin 30 MG/0.3ML injection  Commonly known as:  LOVENOX  Inject 0.3 mLs (30 mg total) into the skin daily.     fluconazole 150 MG tablet  Commonly known as:  DIFLUCAN  Take 1 tablet (150 mg total) by mouth daily. Take 1 table po x 1, repeat in 72 hours if symptoms persist     methocarbamol 500 MG tablet  Commonly known as:  ROBAXIN  Take 1-2 tablets (500-1,000 mg total) by mouth every 6 (six) hours as needed for muscle spasms.     multivitamin tablet  Take 1 tablet by mouth daily.     omeprazole 40 MG capsule  Commonly known as:  PRILOSEC  Take 40 mg by mouth daily as needed (heartburn).     ondansetron 4 MG tablet  Commonly known as:  ZOFRAN  Take 4 mg by mouth.     oxyCODONE 5 MG immediate release tablet  Commonly known as:  Oxy IR/ROXICODONE  Take 1-2 tablets (5-10 mg  total) by mouth every 6 (six) hours as needed for breakthrough pain (take between nucynta doses for breakthrough pain).     tapentadol 50 MG Tabs tablet  Commonly known as:  NUCYNTA  Take 1-2 tablets (50-100 mg total) by mouth every 6 (six) hours as needed for moderate pain or severe pain.     traMADol 50 MG tablet  Commonly known as:  ULTRAM  Take 1-2 tablets (50-100 mg total) by mouth every 6 (six) hours as needed for moderate pain.           Follow-up Information    Follow up with HANDY,Garris H, MD. Schedule an appointment as soon as possible for a visit in 2 weeks.   Specialty:  Orthopedic Surgery   Why:  For suture removal, For wound re-check   Contact information:   [address] [address] [phone]       Discharge Instructions and Plan:  60 y/o male s/p repair L distal tibia nonunion and L achilles tendon lengthening     -L distal tibia nonunion s/p repair with RIA and hardware exchange               NWB x 6 weeks             Splint x 2 weeks then unrestricted ROM                     Ice and elevate             Activity as tolerated o/w  -L achilles tendon lengthening             As above   - Pain management:             Nucynta, tramadol, oxy IR  - Hemodynamics             Stable               Wife/family inquired about Iron supplementation- do not feel pt needs. Eats adequate animal products including meat.               Do not think pt would benefit and may pose more risk   - Medical issues               GERD.HTN  Home meds  - DVT/PE prophylaxis:             Dc home with lovenox x 14 days - ID:               Completed periop abx             Pt with hx of oral thrush post IV abx             Some sx yesterday, today seems improved             Will send home with Rx for diflucan only to be filled if symptoms persist   - Metabolic Bone Disease:             Hx of vitamin D insufficiency                            Resolved                         25 OH vitamin D > 60                           Calcitriol levels appropriate as well                                                    Dc vitamin d2 50,000 IUs weekly                         Vitamin d3 2000 IUs daily                         Recheck vitamin d levels after summer (sept/oct)  - Activity:             NWB L leg o/w as tolerated  - FEN/GI prophylaxis/Foley/Lines:             protonix             Diet as tolerated   - Dispo:             Dc home today             Follow up in 2 weeks   Signed:  Jari Pigg, PA-C [facility] [phone] (P) 05/03/2016, 12:23 PM

## 2016-05-03 NOTE — Discharge Instructions (Signed)
Orthopaedic Trauma Service Discharge Instructions   General Discharge Instructions  WEIGHT BEARING STATUS: Nonweightbearing Left Leg   RANGE OF MOTION/ACTIVITY: range of motion as tolerated Left knee and hip  Wound Care: keep splint clean and dry. Ok to wash L hip wound with soap and water. Can leave uncovered once drainage stops   PAIN MEDICATION USE AND EXPECTATIONS  You have likely been given narcotic medications to help control your pain.  After a traumatic event that results in an fracture (broken bone) with or without surgery, it is ok to use narcotic pain medications to help control one's pain.  We understand that everyone responds to pain differently and each individual patient will be evaluated on a regular basis for the continued need for narcotic medications. Ideally, narcotic medication use should last no more than 6-8 weeks (coinciding with fracture healing).   As a patient it is your responsibility as well to monitor narcotic medication use and report the amount and frequency you use these medications when you come to your office visit.   We would also advise that if you are using narcotic medications, you should take a dose prior to therapy to maximize you participation.  IF YOU ARE ON NARCOTIC MEDICATIONS IT IS NOT PERMISSIBLE TO OPERATE A MOTOR VEHICLE (MOTORCYCLE/CAR/TRUCK/MOPED) OR HEAVY MACHINERY DO NOT MIX NARCOTICS WITH OTHER CNS (CENTRAL NERVOUS SYSTEM) DEPRESSANTS SUCH AS ALCOHOL  Diet: as you were eating previously.  Can use over the counter stool softeners and bowel preparations, such as Miralax, to help with bowel movements.  Narcotics can be constipating.  Be sure to drink plenty of fluids    STOP SMOKING OR USING NICOTINE PRODUCTS!!!!  As discussed nicotine severely impairs your body's ability to heal surgical and traumatic wounds but also impairs bone healing.  Wounds and bone heal by forming microscopic blood vessels (angiogenesis) and nicotine is a  vasoconstrictor (essentially, shrinks blood vessels).  Therefore, if vasoconstriction occurs to these microscopic blood vessels they essentially disappear and are unable to deliver necessary nutrients to the healing tissue.  This is one modifiable factor that you can do to dramatically increase your chances of healing your injury.    (This means no smoking, no nicotine gum, patches, etc)  DO NOT USE NONSTEROIDAL ANTI-INFLAMMATORY DRUGS (NSAID'S)  Using products such as Advil (ibuprofen), Aleve (naproxen), Motrin (ibuprofen) for additional pain control during fracture healing can delay and/or prevent the healing response.  If you would like to take over the counter (OTC) medication, Tylenol (acetaminophen) is ok.  However, some narcotic medications that are given for pain control contain acetaminophen as well. Therefore, you should not exceed more than 4000 mg of tylenol in a day if you do not have liver disease.  Also note that there are may OTC medicines, such as cold medicines and allergy medicines that my contain tylenol as well.  If you have any questions about medications and/or interactions please ask your doctor/PA or your pharmacist.      ICE AND ELEVATE INJURED/OPERATIVE EXTREMITY  Using ice and elevating the injured extremity above your heart can help with swelling and pain control.  Icing in a pulsatile fashion, such as 20 minutes on and 20 minutes off, can be followed.    Do not place ice directly on skin. Make sure there is a barrier between to skin and the ice pack.    Using frozen items such as frozen peas works well as the conform nicely to the are that needs to be iced.  USE AN ACE WRAP OR TED HOSE FOR SWELLING CONTROL  In addition to icing and elevation, Ace wraps or TED hose are used to help limit and resolve swelling.  It is recommended to use Ace wraps or TED hose until you are informed to stop.    When using Ace Wraps start the wrapping distally (farthest away from the body) and  wrap proximally (closer to the body)   Example: If you had surgery on your leg or thing and you do not have a splint on, start the ace wrap at the toes and work your way up to the thigh        If you had surgery on your upper extremity and do not have a splint on, start the ace wrap at your fingers and work your way up to the upper arm  IF YOU ARE IN A SPLINT OR CAST DO NOT [place]   If your splint gets wet for any reason please contact the office immediately. You may shower in your splint or cast as long as you keep it dry.  This can be done by wrapping in a cast cover or garbage back (or similar)  Do Not stick any thing down your splint or cast such as pencils, money, or hangers to try and scratch yourself with.  If you feel itchy take benadryl as prescribed on the bottle for itching  IF YOU ARE IN A CAM BOOT (BLACK BOOT)  You may remove boot periodically. Perform daily dressing changes as noted below.  Wash the liner of the boot regularly and wear a sock when wearing the boot. It is recommended that you sleep in the boot until told otherwise  [place] OR CONCERTS: [id number]

## 2016-05-04 LAB — TISSUE CULTURE: Culture: NO GROWTH

## 2016-05-04 LAB — VITAMIN K1, SERUM: VITAMIN K1: <0.13 ng/mL — ABNORMAL LOW (ref 0.13–1.88)

## 2016-05-06 LAB — ANAEROBIC CULTURE

## 2016-05-06 LAB — TESTOSTERONE, % FREE: Testosterone-% Free: 1.1 % — ABNORMAL LOW (ref 0.2–0.7)

## 2016-10-09 ENCOUNTER — Other Ambulatory Visit: Payer: Self-pay | Admitting: Orthopedic Surgery

## 2016-10-09 DIAGNOSIS — M25572 Pain in left ankle and joints of left foot: Secondary | ICD-10-CM

## 2016-10-18 ENCOUNTER — Ambulatory Visit
Admission: RE | Admit: 2016-10-18 | Discharge: 2016-10-18 | Disposition: A | Discharge status: Home / Self Care | Payer: BLUE CROSS/BLUE SHIELD | Source: Ambulatory Visit | Attending: Orthopedic Surgery | Admitting: Orthopedic Surgery

## 2016-10-18 DIAGNOSIS — M25572 Pain in left ankle and joints of left foot: Secondary | ICD-10-CM

## 2016-11-20 ENCOUNTER — Other Ambulatory Visit: Payer: Self-pay | Admitting: Orthopedic Surgery

## 2016-11-20 DIAGNOSIS — M818 Other osteoporosis without current pathological fracture: Secondary | ICD-10-CM

## 2016-12-27 ENCOUNTER — Ambulatory Visit
Admission: RE | Admit: 2016-12-27 | Discharge: 2016-12-27 | Disposition: A | Discharge status: Home / Self Care | Payer: BLUE CROSS/BLUE SHIELD | Source: Ambulatory Visit | Attending: Orthopedic Surgery | Admitting: Orthopedic Surgery

## 2016-12-27 DIAGNOSIS — M818 Other osteoporosis without current pathological fracture: Secondary | ICD-10-CM

## 2017-04-10 DIAGNOSIS — E291 Testicular hypofunction: Secondary | ICD-10-CM | POA: Insufficient documentation

## 2017-06-23 DEATH — deceased

## 2017-07-12 ENCOUNTER — Other Ambulatory Visit: Payer: Self-pay | Admitting: Nurse Practitioner

## 2017-07-12 DIAGNOSIS — M810 Age-related osteoporosis without current pathological fracture: Secondary | ICD-10-CM

## 2017-08-30 IMAGING — CT CT ANKLE*L* W/O CM
3 of 4 series · 12 of 33 positions shown, 14 images · non-contrast
Comparison: 04/05/2016

CLINICAL DATA: Left ankle pain for 1 year. Prior left ankle
fracture.

EXAM:
CT OF THE LEFT ANKLE WITHOUT CONTRAST
TECHNIQUE: Multidetector CT imaging of the left ankle was performed according
to the standard protocol. Multiplanar CT image reconstructions were
also generated.

[Series 6: soft tissue thin · axial · 0.24mm/px · z∈[+179,+326]mm · 4 of 771 slices shown, 5 images]
[im 141/771  soft-tissue]
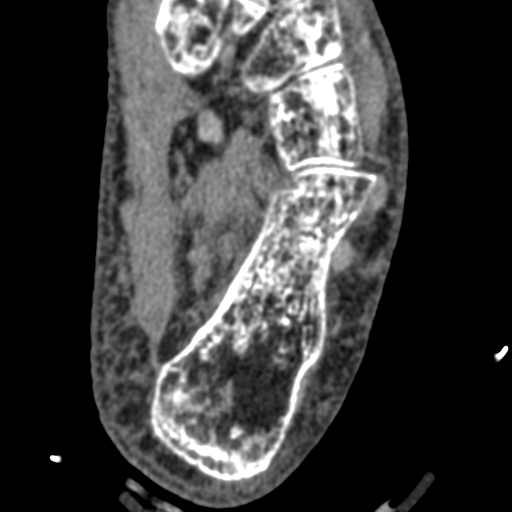
[im 141/771  bone]
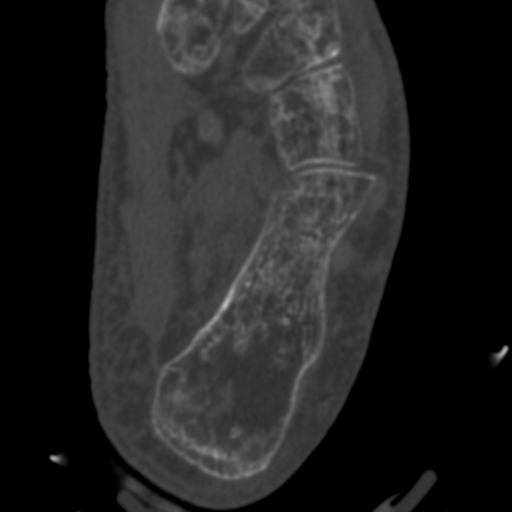
[im 281/771  bone]
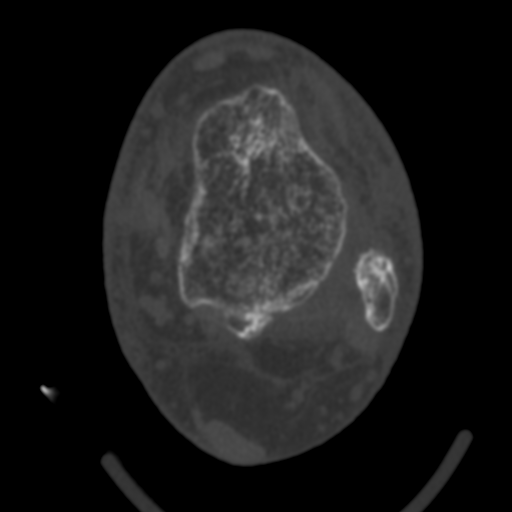
[im 491/771  bone]
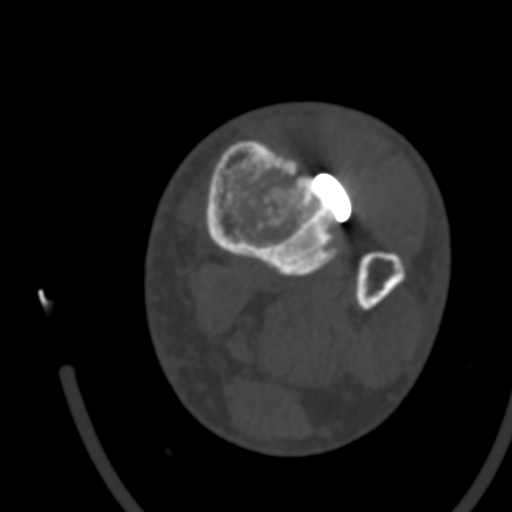
[im 631/771  bone]
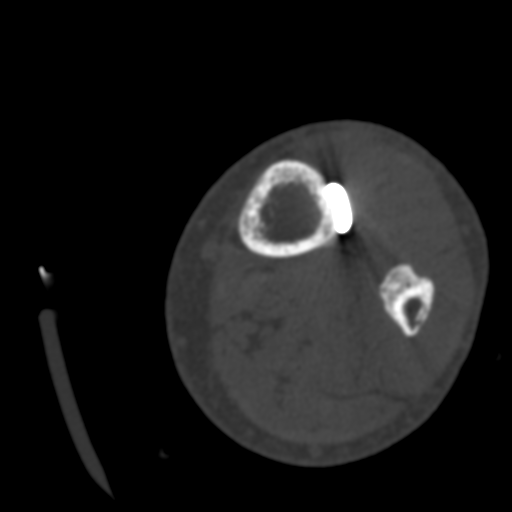

[Series 9: cor soft tissue · coronal · 0.45mm/px · 3 of 61 slices shown]
[im 13/61  bone]
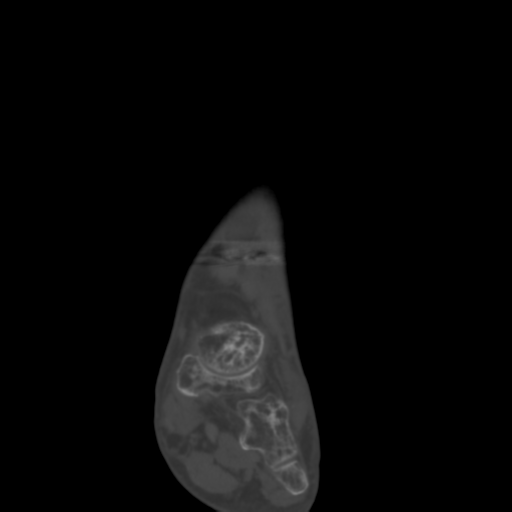
[im 25/61  bone]
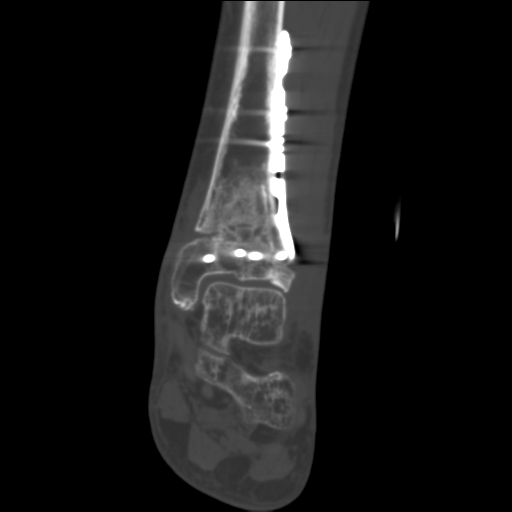
[im 37/61  bone]
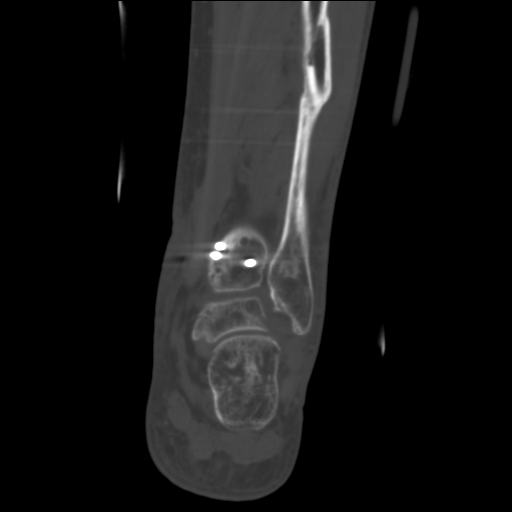

[Series 10: sagsoft tissue · sagittal · 0.26mm/px · 5 of 52 slices shown, 6 images]
[im 18/52  bone]
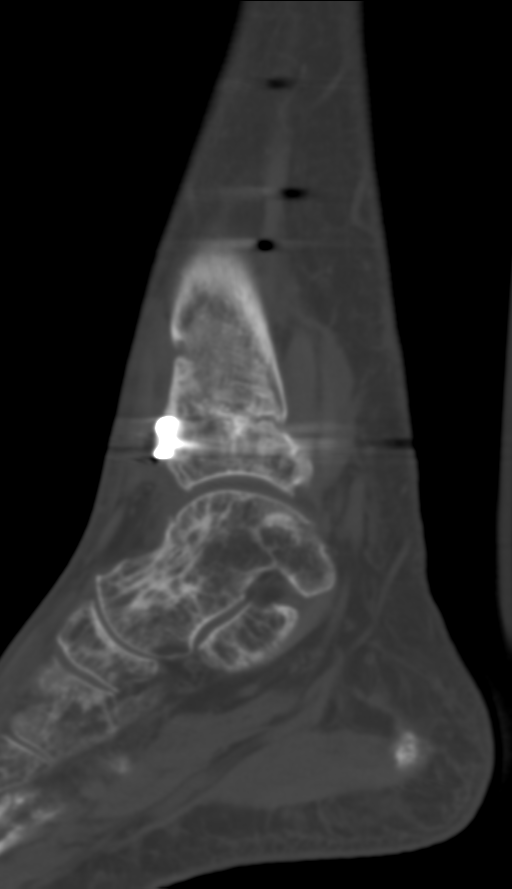
[im 22/52  bone]
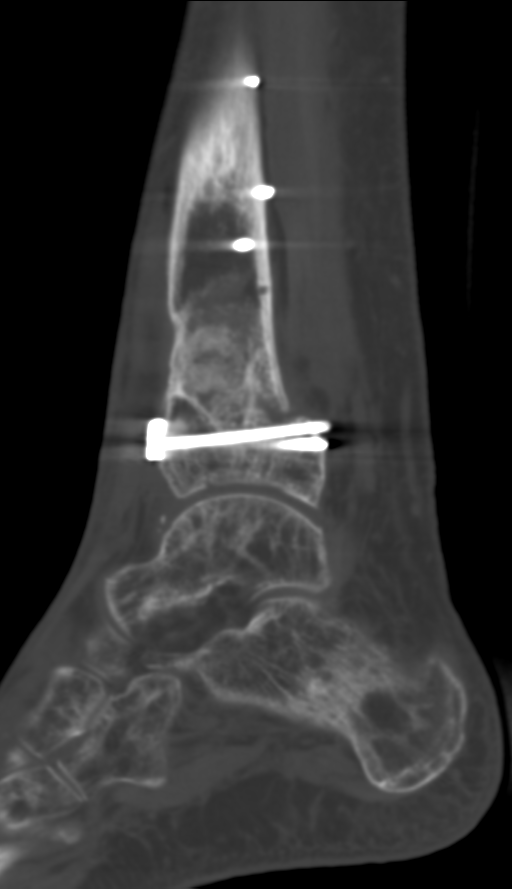
[im 26/52  soft-tissue]
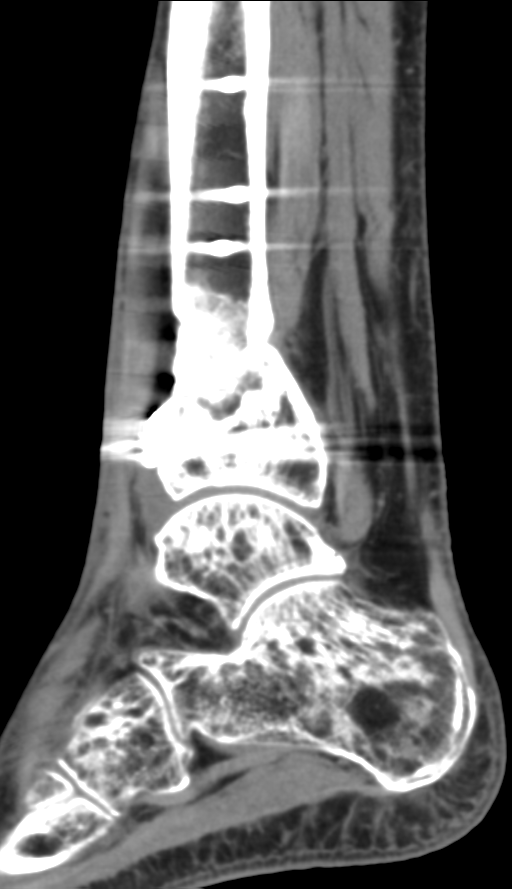
[im 26/52  bone]
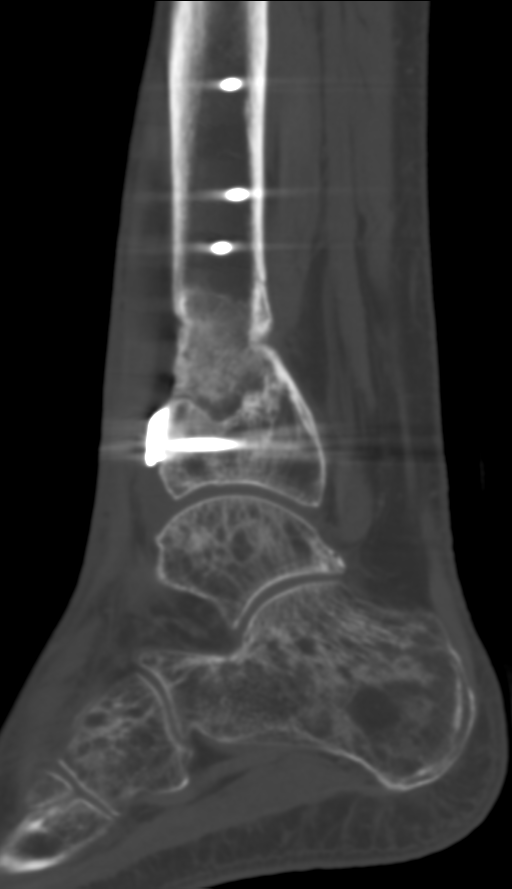
[im 30/52  bone]
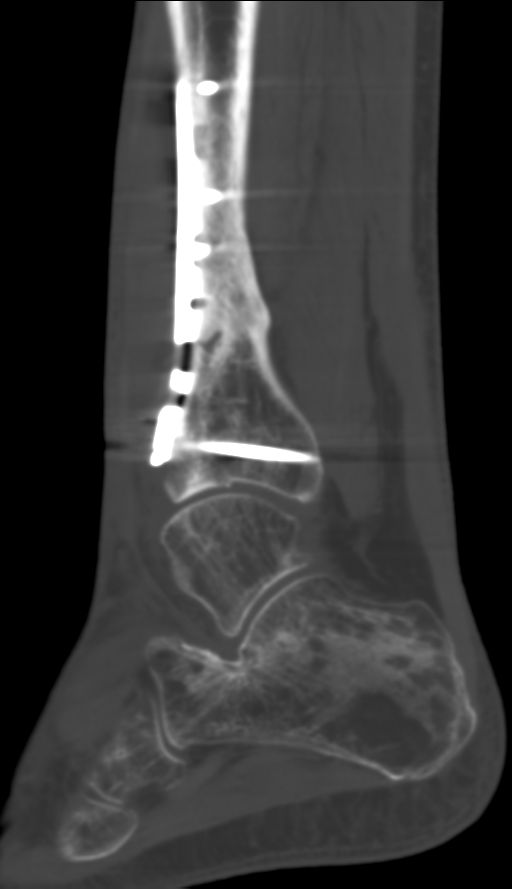
[im 35/52  bone]
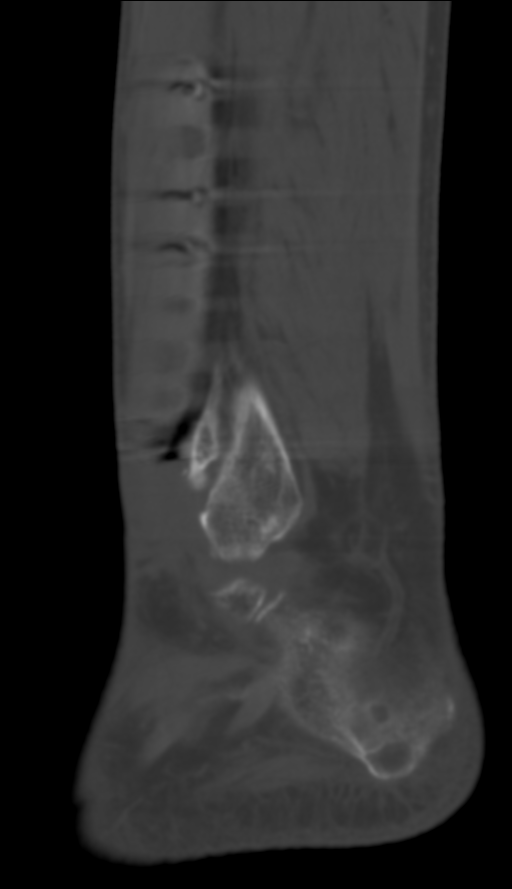

[12 of 33 positions shown; findings below may reference images not displayed]

FINDINGS: Bones/Joint/Cartilage

Severe osteopenia likely secondary to disuse.

Distal tibial metaphysis fracture transfixed with a lateral
sideplate and multiple interlocking screws. Significant interval
osteo-incorporation of osteogenic material at the fracture site with
osseous ridging. Small areas of nonunion along the posterior medial
aspect of the fracture.

Healed distal fibular diaphysis fracture with fracture deformity.

Sliver of bone along the anterior talus likely reflecting an
avulsion fracture.

Normal subtalar joints. No aggressive lytic or sclerotic osseous
lesion.

Ligaments

Suboptimally assessed by CT.

Muscles and Tendons

Muscles are normal. The flexor, extensor, peroneal and Achilles
tendons are intact.

Soft tissues

No fluid collection or hematoma.
IMPRESSION: 1. Distal tibial metaphysis fracture transfixed with a lateral
sideplate and multiple interlocking screws. Significant interval
osteo-incorporation of osteogenic material at the fracture site with
osseous ridging. Small areas of nonunion along the posterior medial
aspect of the fracture.
2. Sliver of bone along the anterior talus likely reflecting an
avulsion fracture. This is new compared with the prior exam.

## 2018-02-24 ENCOUNTER — Inpatient Hospital Stay
Admission: RE | Admit: 2018-02-24 | Discharge: 2018-02-24 | Disposition: A | Discharge status: Home / Self Care | Payer: BLUE CROSS/BLUE SHIELD | Source: Ambulatory Visit | Attending: Nurse Practitioner | Admitting: Nurse Practitioner

## 2018-03-20 ENCOUNTER — Ambulatory Visit
Admission: RE | Admit: 2018-03-20 | Discharge: 2018-03-20 | Disposition: A | Discharge status: Home / Self Care | Payer: BLUE CROSS/BLUE SHIELD | Source: Ambulatory Visit | Attending: Nurse Practitioner | Admitting: Nurse Practitioner

## 2018-03-20 DIAGNOSIS — M810 Age-related osteoporosis without current pathological fracture: Secondary | ICD-10-CM

## 2018-03-28 ENCOUNTER — Ambulatory Visit (INDEPENDENT_AMBULATORY_CARE_PROVIDER_SITE_OTHER): Payer: BLUE CROSS/BLUE SHIELD | Admitting: Vascular Surgery

## 2018-03-28 ENCOUNTER — Encounter (INDEPENDENT_AMBULATORY_CARE_PROVIDER_SITE_OTHER): Payer: Self-pay | Admitting: Vascular Surgery

## 2018-03-28 VITALS — BP 127/82 | HR 82 | Resp 16 | Ht 71.0 in | Wt 203.0 lb

## 2018-03-28 DIAGNOSIS — I10 Essential (primary) hypertension: Secondary | ICD-10-CM

## 2018-03-28 DIAGNOSIS — M79605 Pain in left leg: Secondary | ICD-10-CM

## 2018-03-28 DIAGNOSIS — G9600 Cerebrospinal fluid leak, unspecified: Secondary | ICD-10-CM

## 2018-03-28 DIAGNOSIS — M79609 Pain in unspecified limb: Secondary | ICD-10-CM | POA: Insufficient documentation

## 2018-03-28 DIAGNOSIS — M7989 Other specified soft tissue disorders: Secondary | ICD-10-CM | POA: Diagnosis not present

## 2018-03-28 NOTE — Progress Notes (Signed)
Patient ID: VU LIEBMAN, male   DOB: [birth year], 62 y.o.   MRN: [id number]  Chief Complaint  Patient presents with  . New Patient (Initial Visit)    left le pain (ankle)     HPI Randy Bennett is a 62 y.o. male.  Patient present for evaluation of left lower extremity pain and swelling.  He has a very long and difficult story after a major injury to his left leg.  He has developed RSD in the leg is very sensitive to touch.  There is pronounced discoloration and significant swelling associated with his issues.  The patient had 2 different bone grafts to help heal major ankle and lower leg injuries and it sounds like he almost lost his leg.  Both of these bone grafts had difficulties with healing with the first bone graft not healing at all.  He reports being told by his orthopedic doctor as well as his RSD specialist that these did not heal secondary to poor circulation.  He had a venous study done by his RSD specialist which showed popliteal vein reflux bilaterally more significant on the left than the right.  No mention is made of superficial venous reflux or evaluation.  No DVT or superficial thrombophlebitis was identified.  He also describes a sensation in his calf and lower leg with walking but feels like cramping.  He reports no current ulceration or infection.  He denies any fevers or chills.  He really has no right leg symptoms.  All of his symptoms are in the left leg and they really all started after his injury a year ago.  Current Outpatient Medications  Medication Sig Dispense Refill  . atorvastatin (LIPITOR) 10 MG tablet Take 10 mg by mouth daily at 6 PM.   5  . cetirizine (ZYRTEC) 10 MG tablet Take 10 mg by mouth daily.    Marland Kitchen enoxaparin (LOVENOX) 30 MG/0.3ML injection Inject 0.3 mLs (30 mg total) into the skin daily. (Patient taking differently: Inject 40 mg into the skin daily. ) 14 Syringe 0  . Multiple Vitamin (MULTIVITAMIN) tablet Take 1 tablet by mouth daily.    Marland Kitchen  omeprazole (PRILOSEC) 40 MG capsule Take 40 mg by mouth daily as needed (heartburn).   11  . tapentadol (NUCYNTA) 50 MG TABS tablet Take 1-2 tablets (50-100 mg total) by mouth every 6 (six) hours as needed for moderate pain or severe pain. 90 tablet 0  . atorvastatin (LIPITOR) 20 MG tablet Take 20 mg by mouth daily at 6 PM.    . cholecalciferol 2000 units TABS Take 1 tablet (2,000 Units total) by mouth daily. (Patient not taking: Reported on 03/28/2018) 90 tablet 3  . diazepam (VALIUM) 5 MG tablet Take 5 mg by mouth daily as needed for anxiety.     . docusate sodium (COLACE) 100 MG capsule Take 1 capsule (100 mg total) by mouth 2 (two) times daily. (Patient not taking: Reported on 03/28/2018) 30 capsule 0  . fluconazole (DIFLUCAN) 150 MG tablet Take 1 tablet (150 mg total) by mouth daily. Take 1 table po x 1, repeat in 72 hours if symptoms persist (Patient not taking: Reported on 03/28/2018) 2 tablet 0  . methocarbamol (ROBAXIN) 500 MG tablet Take 1-2 tablets (500-1,000 mg total) by mouth every 6 (six) hours as needed for muscle spasms. (Patient not taking: Reported on 03/28/2018) 90 tablet 1  . ondansetron (ZOFRAN) 4 MG tablet Take 4 mg by mouth.    . oxyCODONE (OXY IR/ROXICODONE)  5 MG immediate release tablet Take 1-2 tablets (5-10 mg total) by mouth every 6 (six) hours as needed for breakthrough pain (take between nucynta doses for breakthrough pain). (Patient not taking: Reported on 03/28/2018) 80 tablet 0  . traMADol (ULTRAM) 50 MG tablet Take 1-2 tablets (50-100 mg total) by mouth every 6 (six) hours as needed for moderate pain. (Patient not taking: Reported on 03/28/2018) 90 tablet 0   No current facility-administered medications for this visit.      Past Medical History:  Diagnosis Date  . Achilles tendon contracture, left 05/03/2016  . Bacterial meningitis 2006  . 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 Squamous- scalp  . Complication of anesthesia    Hyptension and Bradycardia  .  Contracture of left ankle 05/03/2016  . CSF leak 2006  . Dysphagia   . Dysrhythmia    2 - 3 fast beats then returns to noraml  . GERD (gastroesophageal reflux disease)   . Headache    daily- post LP from tick bite- when standing  . Hyperlipidemia   . Hypotension   . Meningitis   . 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 Open left trimalleolar fracture 11/30/2015  . Pneumonia    10- 12 years ago.   Marland Kitchen PONV (postoperative nausea and vomiting)    11/2015- no nausea- treated pre- op with Zofran  . [place] spotted fever 2006  . Shingles   . Viral meningitis 2003  . Vitamin D insufficiency 12/02/2015    Past Surgical History:  Procedure Laterality Date  . ACHILLES TENDON LENGTHENING Left 05/01/2016   Procedure: LEFT ACHILLES TENDON LENGTHENING;  Surgeon: Altamese [place], MD;  Location: [place];  Service: Orthopedics;  Laterality: Left;  . ANKLE FRACTURE SURGERY Left 11/22/2015   Exteral fixation  . APPENDECTOMY  1970  . ARTHROSCOPIC REPAIR ACL Right   . CAPSULOTOMY Left 05/01/2016   Procedure: LEFT ANKLE CAPSULOTOMY, LEFT ANKLE MANIPULATION;  Surgeon: Altamese [place], MD;  Location: [place];  Service: Orthopedics;  Laterality: Left;  . COLONOSCOPY    . COLONOSCOPY WITH PROPOFOL N/A 07/01/2015   Procedure: COLONOSCOPY WITH PROPOFOL;  Surgeon: Manya Silvas, MD;  Location[facility] ENDOSCOPY;  Service: Endoscopy;  Laterality: N/A;  . ESOPHAGOGASTRODUODENOSCOPY    . ESOPHAGOGASTRODUODENOSCOPY N/A 07/01/2015   Procedure: ESOPHAGOGASTRODUODENOSCOPY (EGD);  Surgeon: Manya Silvas, MD;  Location: [facility] ENDOSCOPY;  Service: Endoscopy;  Laterality: N/A;  . EXTERNAL FIXATION LEG Left 11/22/2015   Procedure: EXTERNAL FIXATION LEFT  LEG AND IRRIGATION AND DEBRIDEMENT AND PINNING;  Surgeon: Dorna Leitz, MD;  Location: [place];  Service: Orthopedics;  Laterality: Left;  . EXTERNAL FIXATION REMOVAL Left 11/28/2015   Procedure: REMOVAL EXTERNAL FIXATION LEG;  Surgeon: Altamese [place],  MD;  Location: [place];  Service: Orthopedics;  Laterality: Left;  . HARDWARE REMOVAL Left 05/01/2016   Procedure: HARDWARE REMOVAL LEFT DISTAL TIBIA WITH REVISION OF FIXATION, ;  Surgeon: Altamese [place], MD;  Location: [place];  Service: Orthopedics;  Laterality: Left;  . KNEE ARTHROSCOPY WITH ANTERIOR CRUCIATE LIGAMENT (ACL) REPAIR Right 1988 ACL , 1992 ACL  . LAMINECTOMY  [id number]   CSF leak- from LP   . ORIF TIBIA FRACTURE Left 11/28/2015   Procedure: OPEN REDUCTION INTERNAL FIXATION (ORIF)LEFT  TIBIA  ANKLE FRACTURE;  Surgeon: Altamese , MD;  Location: [place];  Service: Orthopedics;  Laterality: Left;  . SAVORY DILATION N/A 07/01/2015   Procedure: SAVORY DILATION;  Surgeon: Manya Silvas,  MD;  Location: [facility] ENDOSCOPY;  Service: Endoscopy;  Laterality: N/A;  . skin cancer removal  2000-2001  . TIBIA IM NAIL INSERTION Left 05/01/2016   Procedure: LEFT TIBIA REPAIR WITH AUTOGRAFTING, LEFT REAMED INTRAMEDULLARY ASPIRATION;  Surgeon: Altamese [place], MD;  Location: [place];  Service: Orthopedics;  Laterality: Left;    Family History  Problem Relation Age of Onset  . Cancer Mother    No bleeding disorders, clotting disorders, autoimmune diseases or aneurysms  Social History Social History   Tobacco Use  . Smoking status: Never Smoker  . Smokeless tobacco: Never Used  Substance Use Topics  . Alcohol use: No  . Drug use: No     Allergies  Allergen Reactions  . Tylenol [Acetaminophen] Other (See Comments)    Headaches worsen with Tylenol due to CSF  . Lodine [Etodolac] Rash  . Prednisolone Rash        REVIEW OF SYSTEMS (Negative unless checked)  Constitutional: [] Weight loss  [] Fever  [] Chills Cardiac: [] Chest pain   [] Chest pressure   [] Palpitations   [] Shortness of breath when laying flat   [] Shortness of breath at rest   [] Shortness of breath with exertion. Vascular:  [x] Pain in legs with walking   [x] Pain in legs at rest   [x] Pain in legs when laying flat   [] Claudication    [] Pain in feet when walking  [] Pain in feet at rest  [] Pain in feet when laying flat   [] History of DVT   [] Phlebitis   [x] Swelling in legs   [] Varicose veins   [] Non-healing ulcers Pulmonary:   [] Uses home oxygen   [] Productive cough   [] Hemoptysis   [] Wheeze  [] COPD   [] Asthma Neurologic:  [] Dizziness  [] Blackouts   [] Seizures   [] History of stroke   [] History of TIA  [] Aphasia   [] Temporary blindness   [] Dysphagia   [] Weakness or numbness in arms   [] Weakness or numbness in legs X positive for headaches from a CSF leak Musculoskeletal:  [x] Arthritis   [] Joint swelling   [] Joint pain   [] Low back pain Hematologic:  [] Easy bruising  [] Easy bleeding   [] Hypercoagulable state   [] Anemic  [] Hepatitis  Gastrointestinal:  [] Blood in stool   [] Vomiting blood  [] Gastroesophageal reflux/heartburn   [] Difficulty swallowing   Genitourinary:  [] Chronic kidney disease   [] Difficult urination  [] Frequent urination  [] Burning with urination   [] Blood in urine Skin:  [] Rashes   [] Ulcers   [] Wounds Psychological:  [] History of anxiety   []  History of major depression.  Physical Exam BP 127/82 (BP Location: Right Arm)   Pulse 82   Resp 16   Ht 5\' 11"  (1.803 m)   Wt 92.1 kg (203 lb)   BMI 28.31 kg/m   Gen:  WD/WN, NAD. Appears younger than stated age. Head: [place]/AT, No temporalis wasting.  Ear/Nose/Throat: Hearing grossly intact, nares w/o erythema or drainage, oropharynx w/o Erythema/Exudate Eyes: Sclera non-icteric, conjunctiva clear Neck: Trachea midline.  No JVD.  Pulmonary:  Good air movement, no use of accessory muscles.  Cardiac: RRR, normal S1, S2. Vascular:  Vessel Right Left  Radial Palpable Palpable                      Popliteal Palpable Palpable  PT  1+ palpable  1+ palpable  DP Palpable  1+ palpable   Gastrointestinal: soft, non-tender/non-distended.  Musculoskeletal: M/S 5/5 throughout.  Mild to moderate purplish/reddish discoloration of the left lower leg from the mid calf down.  1+ left lower extremity edema. Neurologic:  Sensation grossly intact in extremities.  Symmetrical.  Speech is fluent. Motor exam as listed above. Psychiatric: Judgment intact, Mood & affect appropriate for pt's clinical situation. Dermatologic: No rashes or ulcers noted.  No cellulitis or open wounds.     Radiology Dg Bone Density (dxa)  Result Date: 03/20/2018 EXAM: DUAL X-RAY ABSORPTIOMETRY (DXA) FOR BONE MINERAL DENSITY IMPRESSION: Referring Physician:  Tyreece HANDY PATIENT: Name: Dorsey, Authement Patient ID: [id number] Birth Date: [birth year] Height: 69.7 in. Sex: Male Measured: 03/20/2018 Weight: 190.0 lbs. Indications: Caucasian, Height Loss (781.91), History of Fracture (Adult) (V15.51), History of Osteoporosis, multiple broken bones, Prilosec Fractures: clavicle, Femur, Knee, Rib, Tib/Fib Treatments: Calcium (E943.0), Fosamax, Vitamin D (E933.5) ASSESSMENT: The BMD measured at Femur Neck Left is 0.730 g/cm2 with a T-score of -2.2 is low. This patient is considered osteopenic according to [facility]) criteria. Patient is not eligible for FRAX due to Fosamax There has been a statistically significant increase in BMD of Lumbar spine and Left hip since prior exam dated [id number]. Site Region Measured Date Measured Age YA BMD Significant CHANGE T-score DualFemur Neck Left 03/20/2018    61.1         -2.2    0.730 g/cm2 * AP Spine  L1-L4     03/20/2018    61.1         0.0     1.189 g/cm2 * World Health Organization [facility]) criteria for post-menopausal, Caucasian Women: Normal       T-score at or above -1 SD Osteopenia   T-score between -1 and -2.5 SD Osteoporosis T-score at or below -2.5 SD RECOMMENDATION: [facility] recommends that FDA-approved medical therapies be considered in postmenopausal women and men age 7 or older with a: 1. Hip or vertebral (clinical or morphometric) fracture. 2. T-score of less than or equal to -2.5 at the spine or hip. 3. Ten-year  fracture probability by FRAX of 3% or greater for hip fracture or 20% or greater for major osteoporotic fracture. All treatment decisions require clinical judgment and consideration of individual patient factors, including patient preferences, co-morbidities, previous drug use, risk factors not captured in the FRAX model (e.g. falls, vitamin D deficiency, increased bone turnover, interval significant decline in bone density) and possible under- or over-estimation of fracture risk by FRAX. All patients should ensure an adequate intake of dietary calcium (1200 mg/d) and vitamin D (800 IU daily) unless contraindicated. FOLLOW-UP: People with diagnosed cases of osteoporosis or osteopenia should be regularly tested for bone mineral density. For patients eligible for Medicare, routine testing is allowed once every 2 years. The testing frequency can be increased to one year for patients who have rapidly progressing disease, or for those who are receiving medical therapy to restore bone mass. I have reviewed this report and agree with the above findings. [facility] Radiology Electronically Signed   By: Rolm Baptise M.D.   On: 03/20/2018 09:45    Labs No results found for this or any previous visit (from the past 2160 hour(s)).  Assessment/Plan:  Hypertension blood pressure control important in reducing the progression of atherosclerotic disease. On appropriate oral medications.   CSF leak Chronic problem for over a decade.  Causes him headaches.  Swelling of limb Likely multifactorial with a component of lymphedema probable after his injury as well as potential venous disease.  See workup as planned below.  Pain in limb The patient has been diagnosed with RSD after his trauma.  He has continued pain, poor healing, and swelling.  I do not see mention of superficial venous disease as part of his duplex, so I would recommend a venous reflux study to be done at his convenience.  I have discussed the  pathophysiology and natural history of venous disease and why it could be contributing to his symptoms.  In addition, he has had poor healing and describes some sensations consistent with claudication.  As such, I think a duplex to assess the arteries would also be helpful.  I do not think he would be able to tolerate ABIs.  We discussed that this could be small vessel arterial disease which would not be amenable to revascularization, but certainly large vessel arterial disease could also be present.  We will see him back following the studies to discuss the results and determine further treatment options.     Leotis Pain 03/28/2018, 12:49 PM   This note was created with [facility] medical dictation system.  Any errors from dictation are unintentional.

## 2018-03-28 NOTE — Assessment & Plan Note (Signed)
The patient has been diagnosed with RSD after his trauma.  He has continued pain, poor healing, and swelling.  I do not see mention of superficial venous disease as part of his duplex, so I would recommend a venous reflux study to be done at his convenience.  I have discussed the pathophysiology and natural history of venous disease and why it could be contributing to his symptoms.  In addition, he has had poor healing and describes some sensations consistent with claudication.  As such, I think a duplex to assess the arteries would also be helpful.  I do not think he would be able to tolerate ABIs.  We discussed that this could be small vessel arterial disease which would not be amenable to revascularization, but certainly large vessel arterial disease could also be present.  We will see him back following the studies to discuss the results and determine further treatment options.

## 2018-03-28 NOTE — Assessment & Plan Note (Signed)
blood pressure control important in reducing the progression of atherosclerotic disease. On appropriate oral medications.  

## 2018-03-28 NOTE — Assessment & Plan Note (Signed)
Likely multifactorial with a component of lymphedema probable after his injury as well as potential venous disease.  See workup as planned below.

## 2018-03-28 NOTE — Assessment & Plan Note (Signed)
Chronic problem for over a decade.  Causes him headaches.

## 2018-03-31 ENCOUNTER — Ambulatory Visit (INDEPENDENT_AMBULATORY_CARE_PROVIDER_SITE_OTHER): Payer: BLUE CROSS/BLUE SHIELD

## 2018-03-31 DIAGNOSIS — M7989 Other specified soft tissue disorders: Secondary | ICD-10-CM

## 2018-03-31 DIAGNOSIS — R600 Localized edema: Secondary | ICD-10-CM | POA: Diagnosis not present

## 2018-04-07 ENCOUNTER — Telehealth (INDEPENDENT_AMBULATORY_CARE_PROVIDER_SITE_OTHER): Payer: Self-pay

## 2018-04-07 NOTE — Telephone Encounter (Signed)
Patient called about the Lovenox that the was prescribed to take for a few days. He would like to know if he should be taking something else since he is all done with the Lovenox regimen?  He expressed that the knows that another doctor put him on the Lovenox. And he also wants to know how long he should go without taking it?  [phone]

## 2018-04-08 NOTE — Telephone Encounter (Signed)
Called the patient back to let him know what has been advised. The patient did not answer, so I had to leave a message for him to return my call.

## 2018-04-09 NOTE — Telephone Encounter (Signed)
Spoke to the patient and let him know Dr. Lucky Cowboy had advise for his care at this time. He stated that he understood, and that if he has any follow up questions, he'll be sure to ask at his next appointment on 04/14/18.

## 2018-04-14 ENCOUNTER — Encounter (INDEPENDENT_AMBULATORY_CARE_PROVIDER_SITE_OTHER): Payer: Self-pay | Admitting: Vascular Surgery

## 2018-04-14 ENCOUNTER — Ambulatory Visit (INDEPENDENT_AMBULATORY_CARE_PROVIDER_SITE_OTHER): Payer: BLUE CROSS/BLUE SHIELD | Admitting: Vascular Surgery

## 2018-04-14 VITALS — BP 118/79 | HR 71 | Resp 16 | Ht 71.0 in | Wt 203.0 lb

## 2018-04-14 DIAGNOSIS — I890 Lymphedema, not elsewhere classified: Secondary | ICD-10-CM

## 2018-04-14 DIAGNOSIS — I872 Venous insufficiency (chronic) (peripheral): Secondary | ICD-10-CM

## 2018-04-14 NOTE — Progress Notes (Signed)
Subjective:    Patient ID: Randy Bennett, male    DOB: 09-25-1956, 62 y.o.   MRN: [id number] Chief Complaint  Patient presents with  . Follow-up    ultrasound results   The patient presents today to review vascular studies.  The patient was initially seen on our office on March 28, 2018 for evaluation of chronic venous insufficiency.  The patient does have a past medical history of trauma to the left lower extremity now resulting in RDS.  The patient has been treated by a physician in [place] and diagnosed with venous insufficiency to both the deep and superficial system in the left lower extremity approximately 6 months ago.  The patient does have his paperwork from that doctor stating that he has been engaging in conservative therapy including medical grade 1 compression socks, elevating his legs and remaining active with minimal improvement to his symptoms.  The patient notes that his symptoms have progressed to the point that he is unable to function on a daily basis.  The patient feels that his symptoms are lifestyle limiting.  The patient underwent a left lower extremity arterial duplex which was normal for strong triphasic Doppler waveforms throughout the left femoral popliteal artery system and bilateral distal anterior and posterior tibial arteries.  The patient underwent a left lower extremity venous duplex which was notable for venous insufficiency to the left great saphenous vein as well as the deep system. Please see the patient's prior note on March 28, 2018 for a more in-depth explanation of how the patient's RDS and venous insufficiency are contributing to his lifestyle limiting symptoms.  Review of Systems  Constitutional: Negative.   HENT: Negative.   Eyes: Negative.   Respiratory: Negative.   Cardiovascular: Positive for leg swelling.       Left lower extremity pain  Gastrointestinal: Negative.   Endocrine: Negative.   Genitourinary: Negative.   Musculoskeletal:  Negative.   Skin: Negative.   Allergic/Immunologic: Negative.   Neurological:       RDS  Hematological: Negative.   Psychiatric/Behavioral: Negative.       Objective:   Physical Exam  Constitutional: He is oriented to person, place, and time. He appears well-developed and well-nourished. No distress.  HENT:  Head: Normocephalic and atraumatic.  Right Ear: External ear normal.  Left Ear: External ear normal.  Nose: Nose normal.  Eyes: Pupils are equal, round, and reactive to light. Conjunctivae and EOM are normal.  Neck: Normal range of motion.  Cardiovascular: Normal rate and regular rhythm.  Pulmonary/Chest: Effort normal.  Musculoskeletal: He exhibits edema (Mild to moderate nonpitting edema noted bilaterally).  Neurological: He is alert and oriented to person, place, and time.  Skin: Skin is warm and dry. He is not diaphoretic.  Psychiatric: He has a normal mood and affect. His behavior is normal. Judgment and thought content normal.  Vitals reviewed.  BP 118/79 (BP Location: Right Arm)   Pulse 71   Resp 16   Ht 5\' 11"  (1.803 m)   Wt 203 lb (92.1 kg)   BMI 28.31 kg/m   Past Medical History:  Diagnosis Date  . Achilles tendon contracture, left 05/03/2016  . Bacterial meningitis 2006  . Cancer ([place])    basil cell skin 2000-2001- nasal . Squamous- scalp  . Complication of anesthesia    Hyptension and Bradycardia  . Contracture of left ankle 05/03/2016  . CSF leak 2006  . Dysphagia   . Dysrhythmia    2 - 3 fast  beats then returns to noraml  . GERD (gastroesophageal reflux disease)   . Headache    daily- post LP from tick bite- when standing  . Hyperlipidemia   . Hypotension   . Meningitis   . 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 Open left trimalleolar fracture 11/30/2015  . Pneumonia    10- 12 years ago.   Marland Kitchen PONV (postoperative nausea and vomiting)    11/2015- no nausea- treated pre- op with Zofran  . [place] spotted fever  2006  . Shingles   . Viral meningitis 2003  . Vitamin D insufficiency 12/02/2015   Social History   Socioeconomic History  . Marital status: Married    Spouse name: Not on file  . Number of children: Not on file  . Years of education: Not on file  . Highest education level: Not on file  Occupational History  . Not on file  Social Needs  . Financial resource strain: Not on file  . Food insecurity:    Worry: Not on file    Inability: Not on file  . Transportation needs:    Medical: Not on file    Non-medical: Not on file  Tobacco Use  . Smoking status: Never Smoker  . Smokeless tobacco: Never Used  Substance and Sexual Activity  . Alcohol use: No  . Drug use: No  . Sexual activity: Not on file  Lifestyle  . Physical activity:    Days per week: Not on file    Minutes per session: Not on file  . Stress: Not on file  Relationships  . Social connections:    Talks on phone: Not on file    Gets together: Not on file    Attends religious service: Not on file    Active member of club or organization: Not on file    Attends meetings of clubs or organizations: Not on file    Relationship status: Not on file  . Intimate partner violence:    Fear of current or ex partner: Not on file    Emotionally abused: Not on file    Physically abused: Not on file    Forced sexual activity: Not on file  Other Topics Concern  . Not on file  Social History Narrative  . Not on file   Past Surgical History:  Procedure Laterality Date  . ACHILLES TENDON LENGTHENING Left 05/01/2016   Procedure: LEFT ACHILLES TENDON LENGTHENING;  Surgeon: Altamese Lycoming, MD;  Location: [facility];  Service: Orthopedics;  Laterality: Left;  . ANKLE FRACTURE SURGERY Left 11/22/2015   Exteral fixation  . APPENDECTOMY  1970  . ARTHROSCOPIC REPAIR ACL Right   . CAPSULOTOMY Left 05/01/2016   Procedure: LEFT ANKLE CAPSULOTOMY, LEFT ANKLE MANIPULATION;  Surgeon: Altamese Swanville, MD;  Location: [facility];  Service: Orthopedics;   Laterality: Left;  . COLONOSCOPY    . COLONOSCOPY WITH PROPOFOL N/A 07/01/2015   Procedure: COLONOSCOPY WITH PROPOFOL;  Surgeon: Manya Silvas, MD;  Location[facility] ENDOSCOPY;  Service: Endoscopy;  Laterality: N/A;  . ESOPHAGOGASTRODUODENOSCOPY    . ESOPHAGOGASTRODUODENOSCOPY N/A 07/01/2015   Procedure: ESOPHAGOGASTRODUODENOSCOPY (EGD);  Surgeon: Manya Silvas, MD;  Location: [facility] ENDOSCOPY;  Service: Endoscopy;  Laterality: N/A;  . EXTERNAL FIXATION LEG Left 11/22/2015   Procedure: EXTERNAL FIXATION LEFT  LEG AND IRRIGATION AND DEBRIDEMENT AND PINNING;  Surgeon: Dorna Leitz, MD;  Location: [facility];  Service: Orthopedics;  Laterality: Left;  . EXTERNAL FIXATION REMOVAL Left 11/28/2015  Procedure: REMOVAL EXTERNAL FIXATION LEG;  Surgeon: Altamese Auburndale, MD;  Location: [facility];  Service: Orthopedics;  Laterality: Left;  . HARDWARE REMOVAL Left 05/01/2016   Procedure: HARDWARE REMOVAL LEFT DISTAL TIBIA WITH REVISION OF FIXATION, ;  Surgeon: Altamese Kaanapali, MD;  Location: [facility];  Service: Orthopedics;  Laterality: Left;  . KNEE ARTHROSCOPY WITH ANTERIOR CRUCIATE LIGAMENT (ACL) REPAIR Right 1988 ACL , 1992 ACL  . LAMINECTOMY  [id number]   CSF leak- from LP   . ORIF TIBIA FRACTURE Left 11/28/2015   Procedure: OPEN REDUCTION INTERNAL FIXATION (ORIF)LEFT  TIBIA  ANKLE FRACTURE;  Surgeon: Altamese Putnam, MD;  Location: [facility];  Service: Orthopedics;  Laterality: Left;  . SAVORY DILATION N/A 07/01/2015   Procedure: SAVORY DILATION;  Surgeon: Manya Silvas, MD;  Location: [facility] ENDOSCOPY;  Service: Endoscopy;  Laterality: N/A;  . skin cancer removal  2000-2001  . TIBIA IM NAIL INSERTION Left 05/01/2016   Procedure: LEFT TIBIA REPAIR WITH AUTOGRAFTING, LEFT REAMED INTRAMEDULLARY ASPIRATION;  Surgeon: Altamese [place] Forest, MD;  Location: [facility];  Service: Orthopedics;  Laterality: Left;   Family History  Problem Relation Age of Onset  . Cancer Mother    Allergies  Allergen Reactions  . Tylenol [Acetaminophen] Other (See  Comments)    Headaches worsen with Tylenol due to CSF  . Lodine [Etodolac] Rash  . Prednisolone Rash      Assessment & Plan:  The patient presents today to review vascular studies.  The patient was initially seen on our office on March 28, 2018 for evaluation of chronic venous insufficiency.  The patient does have a past medical history of trauma to the left lower extremity now resulting in RDS.  The patient has been treated by a physician in [place] and diagnosed with venous insufficiency to both the deep and superficial system in the left lower extremity approximately 6 months ago.  The patient does have his paperwork from that doctor stating that he has been engaging in conservative therapy including medical grade 1 compression socks, elevating his legs and remaining active with minimal improvement to his symptoms.  The patient notes that his symptoms have progressed to the point that he is unable to function on a daily basis.  The patient feels that his symptoms are lifestyle limiting.  The patient underwent a left lower extremity arterial duplex which was normal for strong triphasic Doppler waveforms throughout the left femoral popliteal artery system and bilateral distal anterior and posterior tibial arteries.  The patient underwent a left lower extremity venous duplex which was notable for venous insufficiency to the left great saphenous vein as well as the deep system. Please see the patient's prior note on March 28, 2018 for a more in-depth explanation of how the patient's RDS and venous insufficiency are contributing to his lifestyle limiting symptoms.  1. Chronic venous insufficiency - New Patient with venous reflux to the left great saphenous vein as well as the left deep venous system. Due to the anatomic location of the patient's reflux in the deep system he would not be a candidate for laser ablation or sclerotherapy The patient would be a candidate for endovenous laser ablations of  the left great saphenous vein The patient has been engaging in conservative therapy for at least 6 months (please see documentation from his previous doctor as well) including wearing medical grade 1 compression socks, elevating his legs and remaining active with minimal improvement in his symptoms. The patient's symptoms have now progressed to the point he  is unable to function on a daily basis.  The patient's symptoms have become lifestyle limiting. The patient is likely to benefit from endovenous laser ablation. I have discussed the risks and benefits of the procedure. The risks primarily include DVT, recanalization, bleeding, infection, and inability to gain access. I will applied to the patient's insurance Patient is to continue engaging in conservative therapy until he received insurance approval  2. Lymphedema - New Due to the patient's RDS and increased sensitivity to the left lower extremity he may not tolerate the compression from a lymphedema pump however this is a possibility for the future  Current Outpatient Medications on File Prior to Visit  Medication Sig Dispense Refill  . atorvastatin (LIPITOR) 10 MG tablet Take 10 mg by mouth daily at 6 PM.   5  . atorvastatin (LIPITOR) 20 MG tablet Take 20 mg by mouth daily at 6 PM.    . cetirizine (ZYRTEC) 10 MG tablet Take 10 mg by mouth daily.    . diazepam (VALIUM) 5 MG tablet Take 5 mg by mouth daily as needed for anxiety.     . enoxaparin (LOVENOX) 30 MG/0.3ML injection Inject 0.3 mLs (30 mg total) into the skin daily. (Patient taking differently: Inject 40 mg into the skin daily. ) 14 Syringe 0  . Multiple Vitamin (MULTIVITAMIN) tablet Take 1 tablet by mouth daily.    Marland Kitchen omeprazole (PRILOSEC) 40 MG capsule Take 40 mg by mouth daily as needed (heartburn).   11  . ondansetron (ZOFRAN) 4 MG tablet Take 4 mg by mouth.    . tapentadol (NUCYNTA) 50 MG TABS tablet Take 1-2 tablets (50-100 mg total) by mouth every 6 (six) hours as needed for  moderate pain or severe pain. 90 tablet 0  . Testosterone 20.25 MG/ACT (1.62%) GEL     . cholecalciferol 2000 units TABS Take 1 tablet (2,000 Units total) by mouth daily. (Patient not taking: Reported on 03/28/2018) 90 tablet 3  . docusate sodium (COLACE) 100 MG capsule Take 1 capsule (100 mg total) by mouth 2 (two) times daily. (Patient not taking: Reported on 03/28/2018) 30 capsule 0  . fluconazole (DIFLUCAN) 150 MG tablet Take 1 tablet (150 mg total) by mouth daily. Take 1 table po x 1, repeat in 72 hours if symptoms persist (Patient not taking: Reported on 03/28/2018) 2 tablet 0  . methocarbamol (ROBAXIN) 500 MG tablet Take 1-2 tablets (500-1,000 mg total) by mouth every 6 (six) hours as needed for muscle spasms. (Patient not taking: Reported on 03/28/2018) 90 tablet 1  . oxyCODONE (OXY IR/ROXICODONE) 5 MG immediate release tablet Take 1-2 tablets (5-10 mg total) by mouth every 6 (six) hours as needed for breakthrough pain (take between nucynta doses for breakthrough pain). (Patient not taking: Reported on 03/28/2018) 80 tablet 0  . traMADol (ULTRAM) 50 MG tablet Take 1-2 tablets (50-100 mg total) by mouth every 6 (six) hours as needed for moderate pain. (Patient not taking: Reported on 03/28/2018) 90 tablet 0   No current facility-administered medications on file prior to visit.    There are no Patient Instructions on file for this visit. No follow-ups on file.  KIMBERLY A STEGMAYER, PA-C

## 2018-04-17 ENCOUNTER — Ambulatory Visit (INDEPENDENT_AMBULATORY_CARE_PROVIDER_SITE_OTHER): Payer: BLUE CROSS/BLUE SHIELD | Admitting: Vascular Surgery

## 2018-04-17 ENCOUNTER — Encounter (INDEPENDENT_AMBULATORY_CARE_PROVIDER_SITE_OTHER): Payer: BLUE CROSS/BLUE SHIELD

## 2018-04-30 ENCOUNTER — Telehealth (INDEPENDENT_AMBULATORY_CARE_PROVIDER_SITE_OTHER): Payer: Self-pay | Admitting: Vascular Surgery

## 2018-04-30 NOTE — Telephone Encounter (Signed)
Just call the patient back to give him the next available with Dr. Lucky Cowboy to discuss Laser procedure. You can schedule it at his convenience. Thanks.

## 2018-05-16 ENCOUNTER — Ambulatory Visit (INDEPENDENT_AMBULATORY_CARE_PROVIDER_SITE_OTHER): Payer: BLUE CROSS/BLUE SHIELD | Admitting: Vascular Surgery

## 2018-05-27 ENCOUNTER — Encounter (INDEPENDENT_AMBULATORY_CARE_PROVIDER_SITE_OTHER): Payer: Self-pay | Admitting: Vascular Surgery

## 2018-05-27 ENCOUNTER — Ambulatory Visit (INDEPENDENT_AMBULATORY_CARE_PROVIDER_SITE_OTHER): Payer: BLUE CROSS/BLUE SHIELD | Admitting: Vascular Surgery

## 2018-05-27 VITALS — BP 119/78 | HR 81 | Resp 13 | Ht 71.5 in | Wt 201.0 lb

## 2018-05-27 DIAGNOSIS — M7989 Other specified soft tissue disorders: Secondary | ICD-10-CM

## 2018-05-27 DIAGNOSIS — I872 Venous insufficiency (chronic) (peripheral): Secondary | ICD-10-CM

## 2018-05-27 DIAGNOSIS — I10 Essential (primary) hypertension: Secondary | ICD-10-CM

## 2018-05-27 DIAGNOSIS — G9600 Cerebrospinal fluid leak, unspecified: Secondary | ICD-10-CM

## 2018-05-27 NOTE — Progress Notes (Signed)
MRN : [id number]  Randy Bennett is a 62 y.o. ([birth year]) male who presents with chief complaint of  Chief Complaint  Patient presents with  . Follow-up    Discuss laser  .  History of Present Illness: Patient returns today in follow up to discuss laser ablation of the left great saphenous vein.  He continues to have pain, discoloration, and swelling of the left leg which is a combination of his RSD and his venous insufficiency.  The pain is predominantly from RSD, but certainly his venous insufficiency may be exacerbating the situation.  He was previously discussed to have laser ablation but was very hesitant to have the procedure done due to the sensitivity on his leg.  He was fearful he would be unable to tolerate the procedure in a standard fashion.  Current Outpatient Medications  Medication Sig Dispense Refill  . atorvastatin (LIPITOR) 10 MG tablet Take 10 mg by mouth daily at 6 PM.   5  . cetirizine (ZYRTEC) 10 MG tablet Take 10 mg by mouth daily.    Marland Kitchen enoxaparin (LOVENOX) 30 MG/0.3ML injection Inject 0.3 mLs (30 mg total) into the skin daily. (Patient taking differently: Inject 40 mg into the skin daily. ) 14 Syringe 0  . Multiple Vitamin (MULTIVITAMIN) tablet Take 1 tablet by mouth daily.    Marland Kitchen omeprazole (PRILOSEC) 40 MG capsule Take 40 mg by mouth daily as needed (heartburn).   11  . tapentadol (NUCYNTA) 50 MG TABS tablet Take 1-2 tablets (50-100 mg total) by mouth every 6 (six) hours as needed for moderate pain or severe pain. 90 tablet 0  . atorvastatin (LIPITOR) 20 MG tablet Take 20 mg by mouth daily at 6 PM.    . cholecalciferol 2000 units TABS Take 1 tablet (2,000 Units total) by mouth daily. (Patient not taking: Reported on 03/28/2018) 90 tablet 3  . diazepam (VALIUM) 5 MG tablet Take 5 mg by mouth daily as needed for anxiety.     . docusate sodium (COLACE) 100 MG capsule Take 1 capsule (100 mg total) by mouth 2 (two) times daily. (Patient not taking: Reported  on 03/28/2018) 30 capsule 0  . fluconazole (DIFLUCAN) 150 MG tablet Take 1 tablet (150 mg total) by mouth daily. Take 1 table po x 1, repeat in 72 hours if symptoms persist (Patient not taking: Reported on 03/28/2018) 2 tablet 0  . methocarbamol (ROBAXIN) 500 MG tablet Take 1-2 tablets (500-1,000 mg total) by mouth every 6 (six) hours as needed for muscle spasms. (Patient not taking: Reported on 03/28/2018) 90 tablet 1  . ondansetron (ZOFRAN) 4 MG tablet Take 4 mg by mouth.    . oxyCODONE (OXY IR/ROXICODONE) 5 MG immediate release tablet Take 1-2 tablets (5-10 mg total) by mouth every 6 (six) hours as needed for breakthrough pain (take between nucynta doses for breakthrough pain). (Patient not taking: Reported on 03/28/2018) 80 tablet 0  . traMADol (ULTRAM) 50 MG tablet Take 1-2 tablets (50-100 mg total) by mouth every 6 (six) hours as needed for moderate pain. (Patient not taking: Reported on 03/28/2018) 90 tablet 0   No current facility-administered medications for this visit.          Past Medical History:  Diagnosis Date  . Achilles tendon contracture, left 05/03/2016  . Bacterial meningitis 2006  . Cancer ([place])    basil cell skin 2000-2001- nasal . Squamous- scalp  . Complication of anesthesia    Hyptension and Bradycardia  . Contracture of left ankle  05/03/2016  . CSF leak 2006  . Dysphagia   . Dysrhythmia    2 - 3 fast beats then returns to noraml  . GERD (gastroesophageal reflux disease)   . Headache    daily- post LP from tick bite- when standing  . Hyperlipidemia   . Hypotension   . Meningitis   . 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 Open left trimalleolar fracture 11/30/2015  . Pneumonia    10- 12 years ago.   Marland Kitchen PONV (postoperative nausea and vomiting)    11/2015- no nausea- treated pre- op with Zofran  . [place] spotted fever 2006  . Shingles   . Viral meningitis 2003  . Vitamin D insufficiency 12/02/2015           Past Surgical History:  Procedure Laterality Date  . ACHILLES TENDON LENGTHENING Left 05/01/2016   Procedure: LEFT ACHILLES TENDON LENGTHENING;  Surgeon: Altamese [place], MD;  Location: [place];  Service: Orthopedics;  Laterality: Left;  . ANKLE FRACTURE SURGERY Left 11/22/2015   Exteral fixation  . APPENDECTOMY  1970  . ARTHROSCOPIC REPAIR ACL Right   . CAPSULOTOMY Left 05/01/2016   Procedure: LEFT ANKLE CAPSULOTOMY, LEFT ANKLE MANIPULATION;  Surgeon: Altamese [place], MD;  Location: [place];  Service: Orthopedics;  Laterality: Left;  . COLONOSCOPY    . COLONOSCOPY WITH PROPOFOL N/A 07/01/2015   Procedure: COLONOSCOPY WITH PROPOFOL;  Surgeon: Manya Silvas, MD;  [facility] ENDOSCOPY;  Service: Endoscopy;  Laterality: N/A;  . ESOPHAGOGASTRODUODENOSCOPY    . ESOPHAGOGASTRODUODENOSCOPY N/A 07/01/2015   Procedure: ESOPHAGOGASTRODUODENOSCOPY (EGD);  Surgeon: Manya Silvas, MD;  Location[facility] ENDOSCOPY;  Service: Endoscopy;  Laterality: N/A;  . EXTERNAL FIXATION LEG Left 11/22/2015   Procedure: EXTERNAL FIXATION LEFT  LEG AND IRRIGATION AND DEBRIDEMENT AND PINNING;  Surgeon: Dorna Leitz, MD;  Location: [place];  Service: Orthopedics;  Laterality: Left;  . EXTERNAL FIXATION REMOVAL Left 11/28/2015   Procedure: REMOVAL EXTERNAL FIXATION LEG;  Surgeon: Altamese [place], MD;  Location: [place];  Service: Orthopedics;  Laterality: Left;  . HARDWARE REMOVAL Left 05/01/2016   Procedure: HARDWARE REMOVAL LEFT DISTAL TIBIA WITH REVISION OF FIXATION, ;  Surgeon: Altamese [place], MD;  Location: [place];  Service: Orthopedics;  Laterality: Left;  . KNEE ARTHROSCOPY WITH ANTERIOR CRUCIATE LIGAMENT (ACL) REPAIR Right 1988 ACL , 1992 ACL  . LAMINECTOMY  [id number]   CSF leak- from LP   . ORIF TIBIA FRACTURE Left 11/28/2015   Procedure: OPEN REDUCTION INTERNAL FIXATION (ORIF)LEFT  TIBIA  ANKLE FRACTURE;  Surgeon: Altamese Chisholm, MD;  Location: [place];  Service: Orthopedics;  Laterality: Left;  . SAVORY DILATION N/A  07/01/2015   Procedure: SAVORY DILATION;  Surgeon: Manya Silvas, MD;  Location: [facility] ENDOSCOPY;  Service: Endoscopy;  Laterality: N/A;  . skin cancer removal  2000-2001  . TIBIA IM NAIL INSERTION Left 05/01/2016   Procedure: LEFT TIBIA REPAIR WITH AUTOGRAFTING, LEFT REAMED INTRAMEDULLARY ASPIRATION;  Surgeon: Altamese , MD;  Location: [place];  Service: Orthopedics;  Laterality: Left;         Family History  Problem Relation Age of Onset  . Cancer Mother    No bleeding disorders, clotting disorders, autoimmune diseases or aneurysms  Social History Social History       Tobacco Use  . Smoking status: Never Smoker  . Smokeless tobacco: Never Used  Substance Use Topics  . Alcohol use: No  . Drug use: No  Allergies  Allergen Reactions  . Tylenol [Acetaminophen] Other (See Comments)    Headaches worsen with Tylenol due to CSF  . Lodine [Etodolac] Rash  . Prednisolone Rash        REVIEW OF SYSTEMS (Negative unless checked)  Constitutional: [] Weight loss  [] Fever  [] Chills Cardiac: [] Chest pain   [] Chest pressure   [] Palpitations   [] Shortness of breath when laying flat   [] Shortness of breath at rest   [] Shortness of breath with exertion. Vascular:  [x] Pain in legs with walking   [x] Pain in legs at rest   [x] Pain in legs when laying flat   [] Claudication   [] Pain in feet when walking  [] Pain in feet at rest  [] Pain in feet when laying flat   [] History of DVT   [] Phlebitis   [x] Swelling in legs   [] Varicose veins   [] Non-healing ulcers Pulmonary:   [] Uses home oxygen   [] Productive cough   [] Hemoptysis   [] Wheeze  [] COPD   [] Asthma Neurologic:  [] Dizziness  [] Blackouts   [] Seizures   [] History of stroke   [] History of TIA  [] Aphasia   [] Temporary blindness   [] Dysphagia   [] Weakness or numbness in arms   [] Weakness or numbness in legs X positive for headaches from a CSF leak Musculoskeletal:  [x] Arthritis   [] Joint swelling   [] Joint pain   [] Low  back pain Hematologic:  [] Easy bruising  [] Easy bleeding   [] Hypercoagulable state   [] Anemic  [] Hepatitis  Gastrointestinal:  [] Blood in stool   [] Vomiting blood  [] Gastroesophageal reflux/heartburn   [] Difficulty swallowing   Genitourinary:  [] Chronic kidney disease   [] Difficult urination  [] Frequent urination  [] Burning with urination   [] Blood in urine Skin:  [] Rashes   [] Ulcers   [] Wounds Psychological:  [] History of anxiety   []  History of major depression.      Physical Examination  BP 119/78 (BP Location: Right Arm, Patient Position: Sitting)   Pulse 81   Resp 13   Ht 5' 11.5" (1.816 m)   Wt 201 lb (91.2 kg)   BMI 27.64 kg/m  Gen:  WD/WN, NAD Head: [place]/AT, No temporalis wasting. Ear/Nose/Throat: Hearing grossly intact, nares w/o erythema or drainage Eyes: Conjunctiva clear. Sclera non-icteric Neck: Supple.  Trachea midline Pulmonary:  Good air movement, no use of accessory muscles.  Cardiac: RRR, no JVD Vascular:  Vessel Right Left  Radial Palpable Palpable                          PT Palpable 1+ Palpable  DP Palpable Palpable    Musculoskeletal: M/S 5/5 throughout.  Purplish discoloration in the left lower leg from the upper calf down to the foot.  Somewhat sluggish capillary refill.  1+ left lower extremity edema.  Extremely sensitive to touch in the lower leg. Neurologic: Sensation grossly intact in extremities.  Symmetrical.  Speech is fluent.  Psychiatric: Judgment intact, Mood & affect appropriate for pt's clinical situation. Dermatologic: No rashes or ulcers noted.  No cellulitis or open wounds.       Labs No results found for this or any previous visit (from the past 2160 hour(s)).  Radiology No results found.  Assessment/Plan  Hypertension blood pressure control important in reducing the progression of atherosclerotic disease. On appropriate oral medications.   CSF leak Chronic problem for over a decade.  Causes him  headaches.  Swelling of limb Likely multifactorial with a component of lymphedema probable after his injury as well as  potential venous disease.     Chronic venous insufficiency We had a long discussion today regarding potential treatment for his venous disease.  I have discussed the procedure in detail of left great saphenous vein laser ablation.  We would get access around the knee and be treating in the upper leg where he is not nearly as sensitive, but he still is concerned about pain with the procedure.  I have offered him increasing dose of Xanax to try to increase relaxation for the procedure.  He also will take his chronic pain medication which is fine.  If he cannot tolerate the procedure, this may have to be done with moderate conscious sedation either with an anesthesiologist at the office or at the hospital.  The machine is not typically used at the hospital and I am not sure if that will be a possibility but I would certainly be willing to check if he cannot tolerate this with increase Xanax.  There is also the possibility of having an anesthesia provider come to the office although I am not sure that is an option either.  I discussed the typical risks and benefits the procedure including DVT, recanalization, and potentially worsening his neuropathic pain which he understands.  At this time, we are going to attempt to proceed with an increased dose of Xanax and an additional 2 tablets of 0.5 mg were prescribed today.    Leotis Pain, MD  05/29/2018 9:15 AM    This note was created with Dragon medical transcription system.  Any errors from dictation are purely unintentional

## 2018-05-29 NOTE — Assessment & Plan Note (Signed)
We had a long discussion today regarding potential treatment for his venous disease.  I have discussed the procedure in detail of left great saphenous vein laser ablation.  We would get access around the knee and be treating in the upper leg where he is not nearly as sensitive, but he still is concerned about pain with the procedure.  I have offered him increasing dose of Xanax to try to increase relaxation for the procedure.  He also will take his chronic pain medication which is fine.  If he cannot tolerate the procedure, this may have to be done with moderate conscious sedation either with an anesthesiologist at the office or at the hospital.  The machine is not typically used at the hospital and I am not sure if that will be a possibility but I would certainly be willing to check if he cannot tolerate this with increase Xanax.  There is also the possibility of having an anesthesia provider come to the office although I am not sure that is an option either.  I discussed the typical risks and benefits the procedure including DVT, recanalization, and potentially worsening his neuropathic pain which he understands.  At this time, we are going to attempt to proceed with an increased dose of Xanax and an additional 2 tablets of 0.5 mg were prescribed today.

## 2018-06-17 ENCOUNTER — Ambulatory Visit (INDEPENDENT_AMBULATORY_CARE_PROVIDER_SITE_OTHER): Payer: BLUE CROSS/BLUE SHIELD | Admitting: Vascular Surgery

## 2018-06-17 ENCOUNTER — Encounter (INDEPENDENT_AMBULATORY_CARE_PROVIDER_SITE_OTHER): Payer: Self-pay | Admitting: Vascular Surgery

## 2018-06-17 ENCOUNTER — Telehealth (INDEPENDENT_AMBULATORY_CARE_PROVIDER_SITE_OTHER): Payer: Self-pay | Admitting: Vascular Surgery

## 2018-06-17 VITALS — BP 120/78 | HR 72 | Resp 16 | Ht 71.5 in | Wt 200.0 lb

## 2018-06-17 DIAGNOSIS — I872 Venous insufficiency (chronic) (peripheral): Secondary | ICD-10-CM

## 2018-06-17 NOTE — Telephone Encounter (Signed)
Patient called and has some questiion about his laser  appt and woud like to speak with doctor Randy Bennett. He can be reached at [phone]. Patient frustrated that he cant be seen today.

## 2018-06-17 NOTE — Progress Notes (Signed)
Chronic venous insufficiency     The patient's left lower extremity was sterilely prepped and draped. The ultrasound machine was used to visualize the saphenous vein throughout its course. A segment just below the knee was selected for access. The saphenous vein was accessed without difficulty using ultrasound guidance with a micro puncture needle. A micro puncture wire and sheath were then placed. A 0.018 wire was placed beyond the saphenofemoral junction through the sheath and the micro puncture sheath was removed. The 65 cm sheath was then placed over the wire and the wire and dilator were removed. The laser fiber was placed through the sheath and its tip was placed approximately 5 cm below the saphenofemoral junction. Tumescent anesthesia was then created with a dilute lidocaine solution. Laser energy was then delivered with constant withdrawal of the sheath and laser fiber. Approximately 1329 Joules of energy were delivered over a length of 38 cm using the 1470 Hz VenaCure machine at [employer]. Sterile dressings were placed. The patient tolerated the procedure well without complications.

## 2018-06-17 NOTE — Telephone Encounter (Signed)
I called the patient and informed him to come in the office today to have his laser procedure

## 2018-06-19 ENCOUNTER — Encounter (INDEPENDENT_AMBULATORY_CARE_PROVIDER_SITE_OTHER): Payer: BLUE CROSS/BLUE SHIELD

## 2018-06-20 ENCOUNTER — Encounter (INDEPENDENT_AMBULATORY_CARE_PROVIDER_SITE_OTHER): Payer: BLUE CROSS/BLUE SHIELD

## 2018-06-25 ENCOUNTER — Ambulatory Visit (INDEPENDENT_AMBULATORY_CARE_PROVIDER_SITE_OTHER): Payer: BLUE CROSS/BLUE SHIELD

## 2018-06-25 DIAGNOSIS — I872 Venous insufficiency (chronic) (peripheral): Secondary | ICD-10-CM | POA: Diagnosis not present

## 2018-07-01 DIAGNOSIS — G90522 Complex regional pain syndrome I of left lower limb: Secondary | ICD-10-CM | POA: Insufficient documentation

## 2018-07-18 ENCOUNTER — Ambulatory Visit (INDEPENDENT_AMBULATORY_CARE_PROVIDER_SITE_OTHER): Payer: BLUE CROSS/BLUE SHIELD | Admitting: Vascular Surgery

## 2018-07-18 ENCOUNTER — Encounter (INDEPENDENT_AMBULATORY_CARE_PROVIDER_SITE_OTHER): Payer: Self-pay | Admitting: Vascular Surgery

## 2018-07-18 VITALS — BP 140/84 | HR 69 | Resp 14 | Ht 71.5 in | Wt 207.0 lb

## 2018-07-18 DIAGNOSIS — I10 Essential (primary) hypertension: Secondary | ICD-10-CM

## 2018-07-18 DIAGNOSIS — I872 Venous insufficiency (chronic) (peripheral): Secondary | ICD-10-CM

## 2018-07-18 DIAGNOSIS — G9600 Cerebrospinal fluid leak, unspecified: Secondary | ICD-10-CM

## 2018-07-18 DIAGNOSIS — M7989 Other specified soft tissue disorders: Secondary | ICD-10-CM | POA: Diagnosis not present

## 2018-07-18 DIAGNOSIS — M79605 Pain in left leg: Secondary | ICD-10-CM

## 2018-07-18 NOTE — Assessment & Plan Note (Signed)
Largely from his previous injury and RSD.  Not markedly improved after laser ablation

## 2018-07-18 NOTE — Assessment & Plan Note (Signed)
He had a successful ablation without DVT by duplex.  No plans for follow-up sclerotherapy at this time.  Would still benefit from compression and elevation.  Reassess in 3 to 4 months

## 2018-07-18 NOTE — Progress Notes (Signed)
MRN : [id number]  Randy Bennett is a 62 y.o. (Mar 21, 1956) male who presents with chief complaint of  Chief Complaint  Patient presents with  . Follow-up    3-4 week f/u  .  History of Present Illness: Patient returns today in follow up of left leg pain and swelling.  He underwent laser ablation of incompetent left great saphenous vein about a month ago.  His biggest issue with his left leg is his reflex sympathetic dystrophy which is disabling to him.  He has noticed improvement in the color of his left foot although the pain and swelling or not markedly different.  Current Outpatient Medications  Medication Sig Dispense Refill  . alendronate (FOSAMAX) 70 MG tablet   2  . atorvastatin (LIPITOR) 10 MG tablet Take 10 mg by mouth daily at 6 PM.   5  . cetirizine (ZYRTEC) 10 MG tablet Take 10 mg by mouth daily.    . diazepam (VALIUM) 5 MG tablet Take 5 mg by mouth daily as needed for anxiety.     . gabapentin (NEURONTIN) 600 MG tablet   2  . GRALISE 600 MG TABS   0  . HYDROmorphone (DILAUDID) 2 MG tablet hydromorphone 2 mg tablet    . Multiple Vitamin (MULTIVITAMIN) tablet Take 1 tablet by mouth daily.    Marland Kitchen omeprazole (PRILOSEC) 40 MG capsule Take 40 mg by mouth daily as needed (heartburn).   11  . ondansetron (ZOFRAN) 4 MG tablet Take 4 mg by mouth.    . tapentadol (NUCYNTA) 50 MG TABS tablet Take 1-2 tablets (50-100 mg total) by mouth every 6 (six) hours as needed for moderate pain or severe pain. 90 tablet 0  . Testosterone 20.25 MG/ACT (1.62%) GEL     . cholecalciferol 2000 units TABS Take 1 tablet (2,000 Units total) by mouth daily. (Patient not taking: Reported on 03/28/2018) 90 tablet 3  . docusate sodium (COLACE) 100 MG capsule Take 1 capsule (100 mg total) by mouth 2 (two) times daily. (Patient not taking: Reported on 03/28/2018) 30 capsule 0  . enoxaparin (LOVENOX) 30 MG/0.3ML injection Inject 0.3 mLs (30 mg total) into the skin daily. (Patient not taking: Reported on 05/27/2018)  14 Syringe 0  . oxyCODONE (OXY IR/ROXICODONE) 5 MG immediate release tablet Take 1-2 tablets (5-10 mg total) by mouth every 6 (six) hours as needed for breakthrough pain (take between nucynta doses for breakthrough pain). (Patient not taking: Reported on 03/28/2018) 80 tablet 0  . traMADol (ULTRAM) 50 MG tablet Take 1-2 tablets (50-100 mg total) by mouth every 6 (six) hours as needed for moderate pain. (Patient not taking: Reported on 03/28/2018) 90 tablet 0   No current facility-administered medications for this visit.     Past Medical History:  Diagnosis Date  . Achilles tendon contracture, left 05/03/2016  . Bacterial meningitis 2006  . Cancer ([place])    basil cell skin 2000-2001- nasal . Squamous- scalp  . Complication of anesthesia    Hyptension and Bradycardia  . Contracture of left ankle 05/03/2016  . CSF leak 2006  . Dysphagia   . Dysrhythmia    2 - 3 fast beats then returns to noraml  . GERD (gastroesophageal reflux disease)   . Headache    daily- post LP from tick bite- when standing  . Hyperlipidemia   . Hypotension   . Meningitis   . 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 Open left trimalleolar fracture 11/30/2015  .  Pneumonia    10- 12 years ago.   Marland Kitchen PONV (postoperative nausea and vomiting)    11/2015- no nausea- treated pre- op with Zofran  . Rocky Mountain spotted fever 2006  . Shingles   . Viral meningitis 2003  . Vitamin D insufficiency 12/02/2015    Past Surgical History:  Procedure Laterality Date  . ACHILLES TENDON LENGTHENING Left 05/01/2016   Procedure: LEFT ACHILLES TENDON LENGTHENING;  Surgeon: Altamese Cibecue, MD;  Location: [place];  Service: Orthopedics;  Laterality: Left;  . ANKLE FRACTURE SURGERY Left 11/22/2015   Exteral fixation  . APPENDECTOMY  1970  . ARTHROSCOPIC REPAIR ACL Right   . CAPSULOTOMY Left 05/01/2016   Procedure: LEFT ANKLE CAPSULOTOMY, LEFT ANKLE MANIPULATION;  Surgeon: Altamese [place] Salisbury, MD;  Location: [place];   Service: Orthopedics;  Laterality: Left;  . COLONOSCOPY    . COLONOSCOPY WITH PROPOFOL N/A 07/01/2015   Procedure: COLONOSCOPY WITH PROPOFOL;  Surgeon: Manya Silvas, MD;  Location[facility] ENDOSCOPY;  Service: Endoscopy;  Laterality: N/A;  . ESOPHAGOGASTRODUODENOSCOPY    . ESOPHAGOGASTRODUODENOSCOPY N/A 07/01/2015   Procedure: ESOPHAGOGASTRODUODENOSCOPY (EGD);  Surgeon: Manya Silvas, MD;  Location: [facility] ENDOSCOPY;  Service: Endoscopy;  Laterality: N/A;  . EXTERNAL FIXATION LEG Left 11/22/2015   Procedure: EXTERNAL FIXATION LEFT  LEG AND IRRIGATION AND DEBRIDEMENT AND PINNING;  Surgeon: Dorna Leitz, MD;  Location: [place];  Service: Orthopedics;  Laterality: Left;  . EXTERNAL FIXATION REMOVAL Left 11/28/2015   Procedure: REMOVAL EXTERNAL FIXATION LEG;  Surgeon: Altamese Minden, MD;  Location: [place];  Service: Orthopedics;  Laterality: Left;  . HARDWARE REMOVAL Left 05/01/2016   Procedure: HARDWARE REMOVAL LEFT DISTAL TIBIA WITH REVISION OF FIXATION, ;  Surgeon: Altamese Bladenboro, MD;  Location: [place];  Service: Orthopedics;  Laterality: Left;  . KNEE ARTHROSCOPY WITH ANTERIOR CRUCIATE LIGAMENT (ACL) REPAIR Right 1988 ACL , 1992 ACL  . LAMINECTOMY  [id number]   CSF leak- from LP   . ORIF TIBIA FRACTURE Left 11/28/2015   Procedure: OPEN REDUCTION INTERNAL FIXATION (ORIF)LEFT  TIBIA  ANKLE FRACTURE;  Surgeon: Altamese Wasco, MD;  Location: [place];  Service: Orthopedics;  Laterality: Left;  . SAVORY DILATION N/A 07/01/2015   Procedure: SAVORY DILATION;  Surgeon: Manya Silvas, MD;  Location: [facility] ENDOSCOPY;  Service: Endoscopy;  Laterality: N/A;  . skin cancer removal  2000-2001  . TIBIA IM NAIL INSERTION Left 05/01/2016   Procedure: LEFT TIBIA REPAIR WITH AUTOGRAFTING, LEFT REAMED INTRAMEDULLARY ASPIRATION;  Surgeon: Altamese Deltona, MD;  Location: [place];  Service: Orthopedics;  Laterality: Left;    Family History  Problem Relation Age of Onset  . Cancer Mother    No bleeding disorders, clotting disorders,  autoimmune diseases or aneurysms  Social History Social History       Tobacco Use  . Smoking status: Never Smoker  . Smokeless tobacco: Never Used  Substance Use Topics  . Alcohol use: No  . Drug use: No          Allergies  Allergen Reactions  . Tylenol [Acetaminophen] Other (See Comments)    Headaches worsen with Tylenol due to CSF  . Lodine [Etodolac] Rash  . Prednisolone Rash        REVIEW OF SYSTEMS(Negative unless checked)  Constitutional: [] Weight loss[] Fever[] Chills Cardiac:[] Chest pain[] Chest pressure[] Palpitations [] Shortness of breath when laying flat [] Shortness of breath at rest [] Shortness of breath with exertion. Vascular: [x] Pain in legs with walking[x] Pain in legsat rest[x] Pain in legs when laying flat [] Claudication [] Pain in feet when walking [] Pain in  feet at rest [] Pain in feet when laying flat [] History of DVT [] Phlebitis [x] Swelling in legs [] Varicose veins [] Non-healing ulcers Pulmonary: [] Uses home oxygen [] Productive cough[] Hemoptysis [] Wheeze [] COPD [] Asthma Neurologic: [] Dizziness [] Blackouts [] Seizures [] History of stroke [] History of TIA[] Aphasia [] Temporary blindness[] Dysphagia [] Weaknessor numbness in arms [] Weakness or numbnessin legsX positive for headaches from a CSF leak Musculoskeletal: [x] Arthritis [] Joint swelling [] Joint pain [] Low back pain Hematologic:[] Easy bruising[] Easy bleeding [] Hypercoagulable state [] Anemic [] Hepatitis Gastrointestinal:[] Blood in stool[] Vomiting blood[] Gastroesophageal reflux/heartburn[] Difficulty swallowing  Genitourinary: [] Chronic kidney disease [] Difficulturination [] Frequenturination [] Burning with urination[] Blood in urine Skin: [] Rashes [] Ulcers [] Wounds Psychological: [] History of anxiety[] History of major depression.      Physical Examination  BP 140/84  (BP Location: Right Arm, Patient Position: Sitting)   Pulse 69   Resp 14   Ht 5' 11.5" (1.816 m)   Wt 207 lb (93.9 kg)   BMI 28.47 kg/m  Gen:  WD/WN, NAD Head: [place]/AT, No temporalis wasting. Ear/Nose/Throat: Hearing grossly intact, nares w/o erythema or drainage Eyes: Conjunctiva clear. Sclera non-icteric Neck: Supple.  Trachea midline Pulmonary:  Good air movement, no use of accessory muscles.  Cardiac: RRR, no JVD Vascular:  Vessel Right Left  Radial Palpable Palpable                          PT Palpable  1+ palpable  DP Palpable Palpable    Musculoskeletal: M/S 5/5 throughout.  Reddish to purplish discoloration of the left lower leg from the mid calf down to the foot.  This may be slightly improved but is still prominent.  Capillary refill is somewhat sluggish.  1+ left lower extremity edema is present. No right leg edema. Neurologic: Sensation grossly intact in extremities.  Symmetrical.  Speech is fluent.  Psychiatric: Judgment intact, Mood & affect appropriate for pt's clinical situation. Dermatologic: No rashes or ulcers noted.  No cellulitis or open wounds.       Labs No results found for this or any previous visit (from the past 2160 hour(s)).  Radiology No results found.  Assessment/Plan Hypertension blood pressure control important in reducing the progression of atherosclerotic disease. On appropriate oral medications.   CSF leak Chronic problem for over a decade. Causes him headaches.  Swelling of limb Likely multifactorial with a component of lymphedema probable after his injury as well as potential venous disease.    Pain in limb Largely from his previous injury and RSD.  Not markedly improved after laser ablation  Chronic venous insufficiency He had a successful ablation without DVT by duplex.  No plans for follow-up sclerotherapy at this time.  Would still benefit from compression and elevation.  Reassess in 3 to 4 months    Leotis Pain, MD  07/18/2018 10:31 AM    This note was created with Dragon medical transcription system.  Any errors from dictation are purely unintentional

## 2018-11-18 ENCOUNTER — Ambulatory Visit (INDEPENDENT_AMBULATORY_CARE_PROVIDER_SITE_OTHER): Payer: BLUE CROSS/BLUE SHIELD | Admitting: Vascular Surgery

## 2019-04-14 DIAGNOSIS — G479 Sleep disorder, unspecified: Secondary | ICD-10-CM | POA: Insufficient documentation

## 2019-04-14 DIAGNOSIS — Z79899 Other long term (current) drug therapy: Secondary | ICD-10-CM | POA: Insufficient documentation

## 2019-04-14 DIAGNOSIS — R4589 Other symptoms and signs involving emotional state: Secondary | ICD-10-CM | POA: Insufficient documentation

## 2019-04-14 DIAGNOSIS — M8580 Other specified disorders of bone density and structure, unspecified site: Secondary | ICD-10-CM | POA: Insufficient documentation

## 2019-04-14 DIAGNOSIS — K219 Gastro-esophageal reflux disease without esophagitis: Secondary | ICD-10-CM | POA: Diagnosis present

## 2019-04-21 ENCOUNTER — Other Ambulatory Visit: Payer: Self-pay

## 2019-04-22 LAB — TESTOSTERONE,FREE AND TOTAL
Testosterone, Free: 4.2 pg/mL — ABNORMAL LOW (ref 6.6–18.1)
Testosterone: 285 ng/dL (ref 264–916)

## 2019-04-23 ENCOUNTER — Other Ambulatory Visit: Payer: Self-pay | Admitting: Nurse Practitioner

## 2019-04-23 DIAGNOSIS — M810 Age-related osteoporosis without current pathological fracture: Secondary | ICD-10-CM

## 2019-07-03 ENCOUNTER — Other Ambulatory Visit: Payer: Self-pay | Admitting: Internal Medicine

## 2019-07-21 DIAGNOSIS — G894 Chronic pain syndrome: Secondary | ICD-10-CM | POA: Diagnosis not present

## 2019-08-14 ENCOUNTER — Telehealth: Payer: Self-pay

## 2019-08-14 ENCOUNTER — Other Ambulatory Visit: Payer: Self-pay | Admitting: Internal Medicine

## 2019-08-14 NOTE — Telephone Encounter (Signed)
appt made for 8/24

## 2019-08-14 NOTE — Telephone Encounter (Signed)
Refilled testosterone gel and pt needs CBC DX: low testosterone. Can have Testosterone levels if pt wants but should wait until yearly physical labs.

## 2019-08-14 NOTE — Telephone Encounter (Signed)
Left message for pt to call back  °

## 2019-08-17 ENCOUNTER — Other Ambulatory Visit: Payer: Self-pay

## 2019-08-18 ENCOUNTER — Other Ambulatory Visit: Payer: 59

## 2019-08-18 ENCOUNTER — Other Ambulatory Visit: Payer: Self-pay

## 2019-08-18 DIAGNOSIS — R7989 Other specified abnormal findings of blood chemistry: Secondary | ICD-10-CM

## 2019-08-19 LAB — CBC WITH DIFFERENTIAL/PLATELET
Basophils Absolute: 0 10*3/uL (ref 0.0–0.2)
Basos: 0 %
EOS (ABSOLUTE): 0.1 10*3/uL (ref 0.0–0.4)
Eos: 1 %
Hematocrit: 44.8 % (ref 37.5–51.0)
Hemoglobin: 14.9 g/dL (ref 13.0–17.7)
Immature Grans (Abs): 0 10*3/uL (ref 0.0–0.1)
Immature Granulocytes: 0 %
Lymphocytes Absolute: 1.1 10*3/uL (ref 0.7–3.1)
Lymphs: 17 %
MCH: 29.7 pg (ref 26.6–33.0)
MCHC: 33.3 g/dL (ref 31.5–35.7)
MCV: 89 fL (ref 79–97)
Monocytes Absolute: 0.7 10*3/uL (ref 0.1–0.9)
Monocytes: 11 %
Neutrophils Absolute: 4.4 10*3/uL (ref 1.4–7.0)
Neutrophils: 71 %
Platelets: 257 10*3/uL (ref 150–450)
RBC: 5.01 x10E6/uL (ref 4.14–5.80)
RDW: 12.5 % (ref 11.6–15.4)
WBC: 6.3 10*3/uL (ref 3.4–10.8)

## 2019-08-27 ENCOUNTER — Other Ambulatory Visit: Payer: Self-pay

## 2019-08-27 ENCOUNTER — Other Ambulatory Visit: Payer: Self-pay | Admitting: Registered Nurse

## 2019-08-27 MED ORDER — OMEPRAZOLE 40 MG PO CPDR: 40.0000 mg | DELAYED_RELEASE_CAPSULE | Freq: Every day | ORAL | 3 refills | Status: DC | PRN

## 2019-08-27 NOTE — Telephone Encounter (Signed)
[facility] pt

## 2019-08-29 ENCOUNTER — Other Ambulatory Visit: Payer: Self-pay | Admitting: Internal Medicine

## 2019-09-01 ENCOUNTER — Telehealth: Payer: Self-pay

## 2019-09-01 NOTE — Telephone Encounter (Signed)
Spoke with Ronalee Belts.  Rx for [facility]ole [facility] because both pharmacies were listed in Epic for him. Requested the medication to be filled at [facility].  Omeprazole 40 mg 1 cap po qd prn for heartburn [facility]. [facility] of [facility] so only [facility] shows as his pharmacy of choices.  AMD

## 2019-09-25 DIAGNOSIS — Z5181 Encounter for therapeutic drug level monitoring: Secondary | ICD-10-CM | POA: Diagnosis not present

## 2019-09-25 DIAGNOSIS — Z79899 Other long term (current) drug therapy: Secondary | ICD-10-CM | POA: Diagnosis not present

## 2019-11-12 DIAGNOSIS — D2261 Melanocytic nevi of right upper limb, including shoulder: Secondary | ICD-10-CM | POA: Diagnosis not present

## 2019-11-12 DIAGNOSIS — X32XXXA Exposure to sunlight, initial encounter: Secondary | ICD-10-CM | POA: Diagnosis not present

## 2019-11-12 DIAGNOSIS — L821 Other seborrheic keratosis: Secondary | ICD-10-CM | POA: Diagnosis not present

## 2019-11-12 DIAGNOSIS — D2262 Melanocytic nevi of left upper limb, including shoulder: Secondary | ICD-10-CM | POA: Diagnosis not present

## 2019-11-12 DIAGNOSIS — Z85828 Personal history of other malignant neoplasm of skin: Secondary | ICD-10-CM | POA: Diagnosis not present

## 2019-11-12 DIAGNOSIS — L570 Actinic keratosis: Secondary | ICD-10-CM | POA: Diagnosis not present

## 2019-11-12 DIAGNOSIS — D225 Melanocytic nevi of trunk: Secondary | ICD-10-CM | POA: Diagnosis not present

## 2019-11-18 ENCOUNTER — Other Ambulatory Visit: Payer: Self-pay

## 2019-11-18 DIAGNOSIS — G90522 Complex regional pain syndrome I of left lower limb: Secondary | ICD-10-CM

## 2019-11-18 MED ORDER — GABAPENTIN 600 MG PO TABS: ORAL_TABLET | ORAL | 0 refills | Status: DC

## 2019-11-18 MED ORDER — GABAPENTIN 600 MG PO TABS: 600.0000 mg | ORAL_TABLET | Freq: Three times a day (TID) | ORAL | 0 refills | Status: DC

## 2019-11-18 NOTE — Telephone Encounter (Signed)
Need to verify with patient if he is taking long acting gralise from [facility] pain institute [facility] pain management or immediate release gabapentin 600mg .  Last appt with them telemedicine 07/21/2019.  Last labs [facility] 12/2018 renal and liver function stable.  Last appt with Dr Roxan Hockey 01/27/2019 physical.

## 2019-11-18 NOTE — Telephone Encounter (Signed)
Patient is going to follow up with [facility] for his chronic pain medications after this bridge on gabapentin 600mg  po with breakfast, lunch and dinner 1 tab #270 RF0 due to Dr Roxan Hockey no longer working at [facility] and APPs rotating in clinic until new physician hired.  [place] PMP website reviewed patient with 25 Rx 7 providers in previous 2 years mainly [facility] providers (4) and pain management (2).  PMHx left leg CRPS controlled with gabapentin and gralise 1800mg  po with dinner and nucynta 25mg  prn per pain management encounter documentation but patient reported he has had to titrate to 3 gabapentin 600mg  during day 1 each meal and 1200mg  gralise po dinner otherwise groggy.  Patient contacted via telephone and discussed his medication list and gabapentin dosing.  Patient verbalized understanding information/instructions, agreed with plan of care and had no further questions at this time.  Last office visit 01/27/2019 with Dr Roxan Hockey.  Pain Management las office visit 09/25/2019.  Labs due 01/14/2020

## 2019-11-18 NOTE — Telephone Encounter (Signed)
Spoke with PT he states that he is currently taking both. He will make an appt with [facility] for future refills/follow ups.

## 2019-11-26 DIAGNOSIS — G4701 Insomnia due to medical condition: Secondary | ICD-10-CM | POA: Diagnosis not present

## 2019-11-26 DIAGNOSIS — G894 Chronic pain syndrome: Secondary | ICD-10-CM | POA: Diagnosis not present

## 2019-11-26 DIAGNOSIS — G90522 Complex regional pain syndrome I of left lower limb: Secondary | ICD-10-CM | POA: Diagnosis not present

## 2019-11-26 DIAGNOSIS — M1710 Unilateral primary osteoarthritis, unspecified knee: Secondary | ICD-10-CM | POA: Diagnosis not present

## 2019-12-25 DIAGNOSIS — Z20822 Contact with and (suspected) exposure to covid-19: Secondary | ICD-10-CM | POA: Diagnosis not present

## 2019-12-25 DIAGNOSIS — Z20828 Contact with and (suspected) exposure to other viral communicable diseases: Secondary | ICD-10-CM | POA: Diagnosis not present

## 2019-12-28 ENCOUNTER — Ambulatory Visit: Payer: Managed Care, Other (non HMO) | Attending: Internal Medicine

## 2020-01-12 ENCOUNTER — Other Ambulatory Visit: Payer: Self-pay | Admitting: Internal Medicine

## 2020-01-12 DIAGNOSIS — E785 Hyperlipidemia, unspecified: Secondary | ICD-10-CM

## 2020-01-14 ENCOUNTER — Other Ambulatory Visit: Payer: Self-pay | Admitting: Internal Medicine

## 2020-01-14 DIAGNOSIS — E291 Testicular hypofunction: Secondary | ICD-10-CM

## 2020-01-15 ENCOUNTER — Other Ambulatory Visit: Payer: Self-pay | Admitting: Physician Assistant

## 2020-01-15 NOTE — Telephone Encounter (Signed)
Request for refill Androgel 1.62 %, 2 pumps q AM Longstanding Rx - weill tolerated Labs are pending next week with annual physical  UTD  now OK for one unit only pending follow up

## 2020-01-21 ENCOUNTER — Other Ambulatory Visit: Payer: Self-pay

## 2020-01-21 ENCOUNTER — Ambulatory Visit: Payer: Self-pay

## 2020-01-21 DIAGNOSIS — Z0000 Encounter for general adult medical examination without abnormal findings: Secondary | ICD-10-CM

## 2020-01-21 LAB — POCT URINALYSIS DIPSTICK
Bilirubin, UA: NEGATIVE
Blood, UA: NEGATIVE
Glucose, UA: NEGATIVE
Ketones, UA: NEGATIVE
Leukocytes, UA: NEGATIVE
Nitrite, UA: NEGATIVE
Protein, UA: NEGATIVE
Spec Grav, UA: 1.02 (ref 1.010–1.025)
Urobilinogen, UA: 0.2 E.U./dL
pH, UA: 6.5 (ref 5.0–8.0)

## 2020-01-21 NOTE — Progress Notes (Signed)
Scheduled to complete physical 01/28/2020 with Laurey Morale, PA-C (Interim Provider).  AMD

## 2020-01-24 LAB — CMP12+LP+TP+TSH+6AC+PSA+CBC?
AST: 20 IU/L (ref 0–40)
Alkaline Phosphatase: 77 IU/L (ref 39–117)
Basophils Absolute: 0 10*3/uL (ref 0.0–0.2)
Bilirubin Total: 0.8 mg/dL (ref 0.0–1.2)
Calcium: 9.1 mg/dL (ref 8.6–10.2)
Estimated CHD Risk: 0.8 times avg. (ref 0.0–1.0)
Hematocrit: 46.8 % (ref 37.5–51.0)
Immature Granulocytes: 0 %
LDL Chol Calc (NIH): 89 mg/dL (ref 0–99)
Lymphs: 24 %
MCHC: 33.8 g/dL (ref 31.5–35.7)
Monocytes: 10 %
Neutrophils: 61 %
Phosphorus: 3.3 mg/dL (ref 2.8–4.1)
Platelets: 250 10*3/uL (ref 150–450)
Potassium: 4.5 mmol/L (ref 3.5–5.2)
Prostate Specific Ag, Serum: 0.8 ng/mL (ref 0.0–4.0)
Sodium: 140 mmol/L (ref 134–144)
TSH: 1.61 u[IU]/mL (ref 0.450–4.500)
Total Protein: 6.8 g/dL (ref 6.0–8.5)
Triglycerides: 105 mg/dL (ref 0–149)
WBC: 5.8 10*3/uL (ref 3.4–10.8)

## 2020-01-24 LAB — CMP12+LP+TP+TSH+6AC+PSA+CBC…
ALT: 22 IU/L (ref 0–44)
Albumin/Globulin Ratio: 1.7 (ref 1.2–2.2)
Albumin: 4.3 g/dL (ref 3.8–4.8)
BUN/Creatinine Ratio: 13 (ref 10–24)
BUN: 16 mg/dL (ref 8–27)
Basos: 1 %
Chloride: 105 mmol/L (ref 96–106)
Chol/HDL Ratio: 4.2 ratio (ref 0.0–5.0)
Cholesterol, Total: 143 mg/dL (ref 100–199)
Creatinine, Ser: 1.24 mg/dL (ref 0.76–1.27)
EOS (ABSOLUTE): 0.2 10*3/uL (ref 0.0–0.4)
Eos: 4 %
Free Thyroxine Index: 1.3 (ref 1.2–4.9)
GFR calc Af Amer: 71 mL/min/{1.73_m2} (ref >59)
GFR calc non Af Amer: 61 mL/min/{1.73_m2} (ref >59)
GGT: 19 IU/L (ref 0–65)
Globulin, Total: 2.5 g/dL (ref 1.5–4.5)
Glucose: 96 mg/dL (ref 65–99)
HDL: 34 mg/dL — ABNORMAL LOW (ref >39)
Hemoglobin: 15.8 g/dL (ref 13.0–17.7)
Immature Grans (Abs): 0 10*3/uL (ref 0.0–0.1)
Iron: 94 ug/dL (ref 38–169)
LDH: 180 IU/L (ref 121–224)
Lymphocytes Absolute: 1.4 10*3/uL (ref 0.7–3.1)
MCH: 30.8 pg (ref 26.6–33.0)
MCV: 91 fL (ref 79–97)
Monocytes Absolute: 0.6 10*3/uL (ref 0.1–0.9)
Neutrophils Absolute: 3.6 10*3/uL (ref 1.4–7.0)
RBC: 5.13 x10E6/uL (ref 4.14–5.80)
RDW: 12.6 % (ref 11.6–15.4)
T3 Uptake Ratio: 22 % — ABNORMAL LOW (ref 24–39)
T4, Total: 6 ug/dL (ref 4.5–12.0)
Uric Acid: 5.7 mg/dL (ref 3.8–8.4)
VLDL Cholesterol Cal: 20 mg/dL (ref 5–40)

## 2020-01-24 LAB — TESTOSTERONE,FREE AND TOTAL
Testosterone, Free: 1.4 pg/mL — ABNORMAL LOW (ref 6.6–18.1)
Testosterone: 189 ng/dL — ABNORMAL LOW (ref 264–916)

## 2020-01-28 ENCOUNTER — Ambulatory Visit: Payer: Self-pay | Admitting: Physician Assistant

## 2020-01-28 ENCOUNTER — Other Ambulatory Visit: Payer: Self-pay

## 2020-01-28 ENCOUNTER — Encounter: Payer: Self-pay | Admitting: Physician Assistant

## 2020-01-28 VITALS — BP 140/88 | HR 65 | Temp 98.6°F | Ht 71.0 in | Wt 205.2 lb

## 2020-01-28 DIAGNOSIS — Z0000 Encounter for general adult medical examination without abnormal findings: Secondary | ICD-10-CM

## 2020-01-28 DIAGNOSIS — R7989 Other specified abnormal findings of blood chemistry: Secondary | ICD-10-CM

## 2020-01-28 DIAGNOSIS — Z23 Encounter for immunization: Secondary | ICD-10-CM

## 2020-02-01 ENCOUNTER — Encounter: Payer: Self-pay | Admitting: Physician Assistant

## 2020-02-01 NOTE — Progress Notes (Signed)
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  Extensive medical hx to include:  RMSF in 2006 with menigitis -required multiple LPs ultimately resulting in chronic migraine headaches Related to chronic CSF leaks Followed by Neurosurgery and Pain Team  2014 Right leg injury with ACL/repair PT care ongoing   2017 Experienced fall from ladder with  Left leg injury with multiple fractures - Complete tib/fib 4 surgeries  Post recovery CRPS(Complex Regional Pain Syndrome) Has occaional spontaneous spasms Gabapentin.Dilaudid.Nucynta Followed by specialist and Pain Clinic team in [place]  Osteopenia in involved leg has now advanced to osteoporosis  Fosomax  2010 Cardiac work up at [facility] Dr Olhman/stress test Elevated lipids- Atorvastatin Low HDL 34 -exercise limitations  2016 colonoscopy WNL- hx adenomatous polyps  Squamous cell cancer scalp- Dr Onnie Graham 5 FU  Decreased testosterone- Androgel Rx Last year began feeling not adequately compensated Lab this year with below normal  189  result  His chief concern at this time is re-evaluation of testosterone replacement therapy For hormonal support as well as focus on osteoporosis   Physical Exam  VS reviewed /nursing notes  Alert ,interative very pleasant gentleman with a positive attitude despite hx complex hx and ongoing discomfort  HEENT grossly WNL ; Eyes :EOMI, conjunctiva clear Ears; clear canals, TM WNL  + Q tips Nose -WNL   Zyrtec seasonal allergies Dentitia in excellent repair     DDS q 6 mos Neck supple , w/o glandular enlargement noted; FROM  Chest CTA, nonlabored Heart RSR no MGR noted Abdomen soft , non tender, no mass, Nml BS  Ambulates with slightly altered gait/L On /Off table without assistance DTRs grossly equal   Plan : Discussion at length with patient  To continue medications as ordered by supporting specialist care He prefers to have his ongoing care in  the locations already established Despite the drive to [facility] intermittently  PT exercises continue at home  Have discussed Urology consult for evaluation/recommendation Re: changes in testosterone replacement  Recommend a single source consultant . [facility] is currently in transition and a new attending has not been identified at this time. Mr Centrella is in favor of a Optometrist in local Urology Office and we will assist in making those arrangements  Tetanus expired - Tdap vaccine today

## 2020-02-02 NOTE — Addendum Note (Signed)
Addended by: Aliene Altes on: 02/02/2020 11:34 AM   Modules accepted: Orders

## 2020-02-19 ENCOUNTER — Other Ambulatory Visit: Payer: Self-pay | Admitting: General Practice

## 2020-02-19 DIAGNOSIS — E291 Testicular hypofunction: Secondary | ICD-10-CM

## 2020-02-19 MED ORDER — TESTOSTERONE 20.25 MG/ACT (1.62%) TD GEL: 2.0000 | Freq: Every day | TRANSDERMAL | 0 refills | Status: DC

## 2020-02-19 NOTE — Telephone Encounter (Signed)
Patient using testosterone  20.25mg /ACT ( 1.62%) gel  2 pumps daily  Free T 1.3     [facility]  01/21/20 low 189 on 01/21/20 Pending consult with Urology - needed to reschedule-now pending  R/s May 2021 Rx refilled to bridge Dr John Giovanni

## 2020-02-19 NOTE — Telephone Encounter (Signed)
Pt needs a refill on his testosterone gel. He will start seeing a Dealer in May. [facility].

## 2020-03-09 ENCOUNTER — Ambulatory Visit: Payer: Self-pay | Admitting: Urology

## 2020-03-24 DIAGNOSIS — Z79899 Other long term (current) drug therapy: Secondary | ICD-10-CM | POA: Diagnosis not present

## 2020-03-24 DIAGNOSIS — Z5181 Encounter for therapeutic drug level monitoring: Secondary | ICD-10-CM | POA: Diagnosis not present

## 2020-03-24 DIAGNOSIS — G894 Chronic pain syndrome: Secondary | ICD-10-CM | POA: Diagnosis not present

## 2020-03-29 ENCOUNTER — Other Ambulatory Visit: Payer: Self-pay

## 2020-03-29 DIAGNOSIS — E291 Testicular hypofunction: Secondary | ICD-10-CM

## 2020-03-29 MED ORDER — TESTOSTERONE 20.25 MG/ACT (1.62%) TD GEL: 2.0000 | Freq: Every day | TRANSDERMAL | 1 refills | Status: DC

## 2020-04-07 ENCOUNTER — Other Ambulatory Visit: Payer: Self-pay | Admitting: Physician Assistant

## 2020-04-07 DIAGNOSIS — E291 Testicular hypofunction: Secondary | ICD-10-CM

## 2020-04-07 MED ORDER — TESTOSTERONE 20.25 MG/ACT (1.62%) TD GEL: 2.0000 | Freq: Every day | TRANSDERMAL | 1 refills | Status: DC

## 2020-04-25 ENCOUNTER — Other Ambulatory Visit: Payer: Self-pay

## 2020-04-25 ENCOUNTER — Ambulatory Visit
Admission: RE | Admit: 2020-04-25 | Discharge: 2020-04-25 | Disposition: A | Discharge status: Home / Self Care | Payer: 59 | Source: Ambulatory Visit | Attending: Nurse Practitioner | Admitting: Nurse Practitioner

## 2020-04-25 DIAGNOSIS — M85852 Other specified disorders of bone density and structure, left thigh: Secondary | ICD-10-CM | POA: Diagnosis not present

## 2020-04-25 DIAGNOSIS — M810 Age-related osteoporosis without current pathological fracture: Secondary | ICD-10-CM

## 2020-04-28 DIAGNOSIS — R69 Illness, unspecified: Secondary | ICD-10-CM | POA: Diagnosis not present

## 2020-04-28 DIAGNOSIS — G478 Other sleep disorders: Secondary | ICD-10-CM | POA: Diagnosis not present

## 2020-04-28 DIAGNOSIS — Z79899 Other long term (current) drug therapy: Secondary | ICD-10-CM | POA: Diagnosis not present

## 2020-04-28 DIAGNOSIS — G90529 Complex regional pain syndrome I of unspecified lower limb: Secondary | ICD-10-CM | POA: Diagnosis not present

## 2020-04-28 DIAGNOSIS — Z7409 Other reduced mobility: Secondary | ICD-10-CM | POA: Diagnosis not present

## 2020-04-28 DIAGNOSIS — G9600 Cerebrospinal fluid leak, unspecified: Secondary | ICD-10-CM | POA: Diagnosis not present

## 2020-04-28 DIAGNOSIS — E291 Testicular hypofunction: Secondary | ICD-10-CM | POA: Diagnosis not present

## 2020-04-28 DIAGNOSIS — M25572 Pain in left ankle and joints of left foot: Secondary | ICD-10-CM | POA: Diagnosis not present

## 2020-04-28 DIAGNOSIS — E230 Hypopituitarism: Secondary | ICD-10-CM | POA: Diagnosis not present

## 2020-04-28 DIAGNOSIS — M810 Age-related osteoporosis without current pathological fracture: Secondary | ICD-10-CM | POA: Diagnosis not present

## 2020-04-28 DIAGNOSIS — K219 Gastro-esophageal reflux disease without esophagitis: Secondary | ICD-10-CM | POA: Diagnosis not present

## 2020-04-28 DIAGNOSIS — G90522 Complex regional pain syndrome I of left lower limb: Secondary | ICD-10-CM | POA: Diagnosis not present

## 2020-04-28 DIAGNOSIS — R519 Headache, unspecified: Secondary | ICD-10-CM | POA: Diagnosis not present

## 2020-05-16 ENCOUNTER — Encounter: Payer: Self-pay | Admitting: Urology

## 2020-05-16 ENCOUNTER — Ambulatory Visit (INDEPENDENT_AMBULATORY_CARE_PROVIDER_SITE_OTHER): Payer: 59 | Admitting: Urology

## 2020-05-16 ENCOUNTER — Other Ambulatory Visit: Payer: Self-pay

## 2020-05-16 VITALS — BP 151/74 | HR 71 | Ht 71.0 in | Wt 194.0 lb

## 2020-05-16 DIAGNOSIS — E349 Endocrine disorder, unspecified: Secondary | ICD-10-CM | POA: Diagnosis not present

## 2020-05-16 DIAGNOSIS — Z125 Encounter for screening for malignant neoplasm of prostate: Secondary | ICD-10-CM

## 2020-05-16 DIAGNOSIS — R972 Elevated prostate specific antigen [PSA]: Secondary | ICD-10-CM | POA: Diagnosis not present

## 2020-05-16 MED ORDER — XYOSTED 75 MG/0.5ML ~~LOC~~ SOAJ: 75.0000 mg | SUBCUTANEOUS | 3 refills | Status: DC

## 2020-05-16 NOTE — Progress Notes (Signed)
05/16/2020 2:31 PM   Randy Bennett 1956/11/01 [id number]  Referring provider: Jan Fireman, PA-C [place],  [place] [id number]  Chief Complaint  Patient presents with  . Other    HPI: Randy Bennett is a 64 y.o. male seen at the request of Laurey Morale, PA-C for hypogonadism.    -Previously followed at [facility] records available for review however was started AndroGel approximately 2 years ago for hypogonadism -Testosterone levels on replacement have been low with last level January 2021 at 189 -On generic AndroGel 1.62% 2 pumps daily -Symptoms include tiredness, fatigue -History complex fracture left lower extremity requiring 4 surgeries now with osteoporosis -LH 2010 6.2, prolactin 11.64     PMH: Past Medical History:  Diagnosis Date  . Achilles tendon contracture, left 05/03/2016  . Bacterial meningitis 2006  . Cancer ([place])    basil cell skin 2000-2001- nasal . Squamous- scalp  . Complication of anesthesia    Hyptension and Bradycardia  . Contracture of left ankle 05/03/2016  . CSF leak 2006  . Dysphagia   . Dysrhythmia    2 - 3 fast beats then returns to noraml  . GERD (gastroesophageal reflux disease)   . Headache    daily- post LP from tick bite- when standing  . Hyperlipidemia   . Hypotension   . Meningitis   . 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 Open left trimalleolar fracture 11/30/2015  . Pneumonia    10- 12 years ago.   Marland Kitchen PONV (postoperative nausea and vomiting)    11/2015- no nausea- treated pre- op with Zofran  . Rocky Mountain spotted fever 2006  . Shingles   . Viral meningitis 2003  . Vitamin D insufficiency 12/02/2015    Surgical History: Past Surgical History:  Procedure Laterality Date  . ACHILLES TENDON LENGTHENING Left 05/01/2016   Procedure: LEFT ACHILLES TENDON LENGTHENING;  Surgeon: Altamese Terry, MD;  Location: [facility];  Service: Orthopedics;  Laterality: Left;  .  ANKLE FRACTURE SURGERY Left 11/22/2015   Exteral fixation  . APPENDECTOMY  1970  . ARTHROSCOPIC REPAIR ACL Right   . CAPSULOTOMY Left 05/01/2016   Procedure: LEFT ANKLE CAPSULOTOMY, LEFT ANKLE MANIPULATION;  Surgeon: Altamese [place], MD;  Location: [place];  Service: Orthopedics;  Laterality: Left;  . COLONOSCOPY    . COLONOSCOPY WITH PROPOFOL N/A 07/01/2015   Procedure: COLONOSCOPY WITH PROPOFOL;  Surgeon: Manya Silvas, MD;  Location: [facility] ENDOSCOPY;  Service: Endoscopy;  Laterality: N/A;  . ESOPHAGOGASTRODUODENOSCOPY    . ESOPHAGOGASTRODUODENOSCOPY N/A 07/01/2015   Procedure: ESOPHAGOGASTRODUODENOSCOPY (EGD);  Surgeon: Manya Silvas, MD;  Location: [facility] ENDOSCOPY;  Service: Endoscopy;  Laterality: N/A;  . EXTERNAL FIXATION LEG Left 11/22/2015   Procedure: EXTERNAL FIXATION LEFT  LEG AND IRRIGATION AND DEBRIDEMENT AND PINNING;  Surgeon: Dorna Leitz, MD;  Location: [facility];  Service: Orthopedics;  Laterality: Left;  . EXTERNAL FIXATION REMOVAL Left 11/28/2015   Procedure: REMOVAL EXTERNAL FIXATION LEG;  Surgeon: Altamese Providence, MD;  Location: [place];  Service: Orthopedics;  Laterality: Left;  . HARDWARE REMOVAL Left 05/01/2016   Procedure: HARDWARE REMOVAL LEFT DISTAL TIBIA WITH REVISION OF FIXATION, ;  Surgeon: Altamese [place], MD;  Location: [facility];  Service: Orthopedics;  Laterality: Left;  . KNEE ARTHROSCOPY WITH ANTERIOR CRUCIATE LIGAMENT (ACL) REPAIR Right 1988 ACL , 1992 ACL  . LAMINECTOMY  [id number]   CSF leak- from LP   . ORIF TIBIA FRACTURE Left 11/28/2015  Procedure: OPEN REDUCTION INTERNAL FIXATION (ORIF)LEFT  TIBIA  ANKLE FRACTURE;  Surgeon: Altamese Carpio, MD;  Location: [facility];  Service: Orthopedics;  Laterality: Left;  . SAVORY DILATION N/A 07/01/2015   Procedure: SAVORY DILATION;  Surgeon: Manya Silvas, MD;  Location: [facility] ENDOSCOPY;  Service: Endoscopy;  Laterality: N/A;  . skin cancer removal  2000-2001  . TIBIA IM NAIL INSERTION Left 05/01/2016   Procedure: LEFT TIBIA REPAIR WITH  AUTOGRAFTING, LEFT REAMED INTRAMEDULLARY ASPIRATION;  Surgeon: Altamese Bangs, MD;  Location: [facility];  Service: Orthopedics;  Laterality: Left;    Home Medications:  Allergies as of 05/16/2020      Reactions   Tylenol [acetaminophen] Other (See Comments)   Headaches worsen with Tylenol due to CSF   Lodine [etodolac] Rash   Prednisone Rash      Medication List       Accurate as of May 16, 2020  2:31 PM. If you have any questions, ask your nurse or doctor.        alendronate 70 MG tablet Commonly known as: FOSAMAX   atorvastatin 10 MG tablet Commonly known as: LIPITOR TAKE ONE TABLET BY MOUTH EVERY DAY   baclofen 10 MG tablet Commonly known as: LIORESAL Take 20 mg by mouth 3 (three) times daily.   cetirizine 10 MG tablet Commonly known as: ZYRTEC Take 10 mg by mouth daily.   Cholecalciferol 125 MCG (5000 UT) Tabs Take 1 tablet by mouth daily.   fluorouracil 5 % cream Commonly known as: EFUDEX Apply 1 application topically daily as needed.   gabapentin 600 MG tablet Commonly known as: NEURONTIN Take 1 tablet (600 mg total) by mouth 3 (three) times daily.   Gralise 600 MG Tabs Generic drug: Gabapentin (Once-Daily) Take by mouth.   multivitamin tablet Take 1 tablet by mouth daily.   naloxone 4 MG/0.1ML Liqd nasal spray kit Commonly known as: NARCAN Place 1 spray into the nose once.   omeprazole 40 MG capsule Commonly known as: PRILOSEC Take 1 capsule (40 mg total) by mouth daily as needed (heartburn).   tapentadol 50 MG tablet Commonly known as: NUCYNTA Take 1-2 tablets (50-100 mg total) by mouth every 6 (six) hours as needed for moderate pain or severe pain.   Testosterone 20.25 MG/ACT (1.62%) Gel Apply 2 Pump topically daily.       Allergies:  Allergies  Allergen Reactions  . Tylenol [Acetaminophen] Other (See Comments)    Headaches worsen with Tylenol due to CSF  . Lodine [Etodolac] Rash  . Prednisone Rash    Family History: Family History    Problem Relation Age of Onset  . Cancer Mother     Social History:  reports that he has never smoked. He has never used smokeless tobacco. He reports that he does not drink alcohol or use drugs.   Physical Exam: BP (!) 151/74   Pulse 71   Ht '5\' 11"'$  (1.803 m)   Wt 194 lb (88 kg)   BMI 27.06 kg/m   Constitutional:  Alert and oriented, No acute distress. HEENT: [place] AT, moist mucus membranes.  Trachea midline, no masses. Cardiovascular: No clubbing, cyanosis, or edema. Respiratory: Normal respiratory effort, no increased work of breathing. Skin: No rashes, bruises or suspicious lesions. Neurologic: Grossly intact, no focal deficits, moving all 4 extremities. Psychiatric: Normal mood and affect.   Assessment & Plan:    1.  Hypogonadism -Limited records available -On TRT x2 years according to patient -Level still low on topical testosterone -We discussed  switching to injections.  Both intramuscular and subcutaneous injections were discussed.  He would prefer subcu injections if covered by insurance and Rx Berdine Addison was sent to pharmacy. -Do not see a recent PSA; recheck prolactin and LH.  LH may not be suppressed with current testosterone level   Abbie Sons, MD  [facility] [address] [place], [place] [address] [phone]

## 2020-05-17 ENCOUNTER — Encounter: Payer: Self-pay | Admitting: Urology

## 2020-05-17 LAB — PROLACTIN: Prolactin: 8.8 ng/mL (ref 4.0–15.2)

## 2020-05-17 LAB — PSA: Prostate Specific Ag, Serum: 0.6 ng/mL (ref 0.0–4.0)

## 2020-05-17 LAB — LUTEINIZING HORMONE: LH: 1.5 m[IU]/mL — ABNORMAL LOW (ref 1.7–8.6)

## 2020-05-17 LAB — TESTOSTERONE: Testosterone: 320 ng/dL (ref 264–916)

## 2020-05-18 ENCOUNTER — Ambulatory Visit: Payer: Self-pay | Admitting: Urology

## 2020-05-20 ENCOUNTER — Telehealth: Payer: Self-pay | Admitting: Family Medicine

## 2020-05-20 NOTE — Telephone Encounter (Signed)
Patient notified and voiced understanding. He has to find a new PCP his has left practice.

## 2020-05-20 NOTE — Telephone Encounter (Signed)
Patient notified and voiced understanding. He has picked up the [facility] but is concerned to start the medication as his BP has been high in the last few days. He has continued to take the BP at home and it has continued to be high.  He asks could the Androgel be the cause of the BP being high? He states he could go off the gel for a few days to see if the BP goes down. He also wants to know should he stay on Androgel if he is not going to start [place] because of the BP or should he stop both and see what happens? He is just not sure what is best.

## 2020-05-20 NOTE — Telephone Encounter (Signed)
-----   Message from Abbie Sons, MD sent at 05/19/2020  8:06 PM EDT ----- PSA better and low normal 320.  PSA normal.  Rx Berdine Addison has been sent to pharmacy and waiting on prior authorization

## 2020-05-20 NOTE — Telephone Encounter (Signed)
AndroGel should not be causing the elevated BP.  Recommend he see his PCP

## 2020-06-09 ENCOUNTER — Other Ambulatory Visit: Payer: Self-pay

## 2020-06-09 ENCOUNTER — Ambulatory Visit: Payer: Self-pay | Admitting: Physician Assistant

## 2020-06-09 VITALS — BP 141/64 | HR 72 | Temp 98.7°F | Resp 14 | Ht 71.0 in | Wt 199.0 lb

## 2020-06-09 DIAGNOSIS — I10 Essential (primary) hypertension: Secondary | ICD-10-CM

## 2020-06-09 DIAGNOSIS — E291 Testicular hypofunction: Secondary | ICD-10-CM

## 2020-06-09 MED ORDER — TESTOSTERONE 20.25 MG/ACT (1.62%) TD GEL: 2.0000 | Freq: Every day | TRANSDERMAL | 1 refills | Status: DC

## 2020-06-09 NOTE — Progress Notes (Signed)
° °  Subjective: Hypogonadism    Patient ID: Randy Bennett, male    DOB: Sep 13, 1956, 64 y.o.   MRN: [id number]  HPI Patient was concerned elevated blood pressure secondary to using testosterone gel.  Patient state he saw urologist and was going to change testosterone from a gel to injections.  Patient state he became concerned before using injection when he noticed his blood pressure was elevated.  Patient state he normally have a BP 140/70.  States 2 episodes this week with blood pressure was greater than 150.  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  Patient blood pressure today is 141/64.  Review of Systems     Hypogonadism, hyperlipidemia, and insomnia. Objective:   Physical Exam No acute distress.  Vital signs stable.  HEENT is unremarkable.  Neck is supple for adenopathy or bruits.  Lungs clear to auscultation.  Heart regular rate and rhythm.       Assessment & Plan: Subjective elevated blood pressure.  Patient advised to continue testosterone gel and have a 3-day blood pressure check at this office along with his home unit.  If patient wishes to discontinue the gel he must discuss with urologist.  Follow-up after 3-day blood pressure check.

## 2020-06-09 NOTE — Progress Notes (Signed)
Pt is concerned if the testosterone gel is effecting his BP.

## 2020-06-13 ENCOUNTER — Other Ambulatory Visit: Payer: Self-pay

## 2020-06-13 ENCOUNTER — Ambulatory Visit: Payer: Self-pay

## 2020-06-13 VITALS — BP 140/80

## 2020-06-13 DIAGNOSIS — I10 Essential (primary) hypertension: Secondary | ICD-10-CM

## 2020-06-13 NOTE — Progress Notes (Signed)
Pt brought in own electric BP cuff and read on left arm : 140/71

## 2020-06-14 ENCOUNTER — Ambulatory Visit: Payer: Self-pay

## 2020-06-14 VITALS — BP 141/82 | HR 61

## 2020-06-14 DIAGNOSIS — Z0130 Encounter for examination of blood pressure without abnormal findings: Secondary | ICD-10-CM

## 2020-06-14 NOTE — Progress Notes (Signed)
Presents for BP check #2 as requested by Randel Pigg, PA-C.  To return to [facility] tomorrow for BP check #3.  Encouraged to decrease sodium intake in diet & increase water intake to help lower BP.  AMD

## 2020-06-15 ENCOUNTER — Ambulatory Visit: Payer: Self-pay

## 2020-06-15 ENCOUNTER — Other Ambulatory Visit: Payer: Self-pay

## 2020-06-15 VITALS — BP 131/74

## 2020-06-15 DIAGNOSIS — Z0130 Encounter for examination of blood pressure without abnormal findings: Secondary | ICD-10-CM

## 2020-06-15 NOTE — Progress Notes (Signed)
Presents for Blood Pressure check #3 as requested by Estrella Myrtle.  AMD

## 2020-06-17 ENCOUNTER — Telehealth: Payer: Self-pay

## 2020-06-17 NOTE — Telephone Encounter (Signed)
Ronalee Belts returned my call.  Informed him that Randel Pigg, PA-C reviewed his blood pressure results from the past 3 days & last week's office visit & doesn't recommend starting blood pressure medication.  Advised him to continue monitoring it at home & if it starts to rise to contact us to schedule an appt with provider.  AMD

## 2020-06-22 ENCOUNTER — Ambulatory Visit: Payer: 59 | Admitting: Emergency Medicine

## 2020-07-07 ENCOUNTER — Other Ambulatory Visit: Payer: Self-pay | Admitting: Physician Assistant

## 2020-07-07 DIAGNOSIS — E785 Hyperlipidemia, unspecified: Secondary | ICD-10-CM

## 2020-08-18 DIAGNOSIS — G4701 Insomnia due to medical condition: Secondary | ICD-10-CM | POA: Diagnosis not present

## 2020-08-18 DIAGNOSIS — G90522 Complex regional pain syndrome I of left lower limb: Secondary | ICD-10-CM | POA: Diagnosis not present

## 2020-08-18 DIAGNOSIS — G894 Chronic pain syndrome: Secondary | ICD-10-CM | POA: Diagnosis not present

## 2020-08-18 DIAGNOSIS — M549 Dorsalgia, unspecified: Secondary | ICD-10-CM | POA: Diagnosis not present

## 2020-09-22 DIAGNOSIS — Z20822 Contact with and (suspected) exposure to covid-19: Secondary | ICD-10-CM | POA: Diagnosis not present

## 2020-09-22 DIAGNOSIS — J069 Acute upper respiratory infection, unspecified: Secondary | ICD-10-CM | POA: Diagnosis not present

## 2020-10-05 ENCOUNTER — Other Ambulatory Visit: Payer: Self-pay

## 2020-10-05 ENCOUNTER — Telehealth: Payer: Self-pay

## 2020-10-05 DIAGNOSIS — K219 Gastro-esophageal reflux disease without esophagitis: Secondary | ICD-10-CM

## 2020-10-05 MED ORDER — OMEPRAZOLE 40 MG PO CPDR: 40.0000 mg | DELAYED_RELEASE_CAPSULE | Freq: Every day | ORAL | 2 refills | Status: DC | PRN

## 2020-10-05 NOTE — Telephone Encounter (Signed)
Refill request already in Epic - routed to [employer] [employer].  AMD

## 2020-10-12 ENCOUNTER — Telehealth: Payer: Self-pay

## 2020-10-12 NOTE — Telephone Encounter (Signed)
Rx refill request already in Epic - re-routed to Ron Smith, PA-C.  AMD 

## 2020-11-04 ENCOUNTER — Other Ambulatory Visit: Payer: Self-pay | Admitting: Physician Assistant

## 2020-11-04 DIAGNOSIS — E291 Testicular hypofunction: Secondary | ICD-10-CM

## 2020-11-08 ENCOUNTER — Telehealth: Payer: Self-pay

## 2020-11-09 ENCOUNTER — Other Ambulatory Visit: Payer: Self-pay | Admitting: Physician Assistant

## 2020-11-10 DIAGNOSIS — M549 Dorsalgia, unspecified: Secondary | ICD-10-CM | POA: Diagnosis not present

## 2020-11-10 DIAGNOSIS — M1710 Unilateral primary osteoarthritis, unspecified knee: Secondary | ICD-10-CM | POA: Diagnosis not present

## 2020-11-10 DIAGNOSIS — G894 Chronic pain syndrome: Secondary | ICD-10-CM | POA: Diagnosis not present

## 2020-11-10 DIAGNOSIS — Z79899 Other long term (current) drug therapy: Secondary | ICD-10-CM | POA: Diagnosis not present

## 2020-11-10 DIAGNOSIS — G4701 Insomnia due to medical condition: Secondary | ICD-10-CM | POA: Diagnosis not present

## 2020-11-10 DIAGNOSIS — Z5181 Encounter for therapeutic drug level monitoring: Secondary | ICD-10-CM | POA: Diagnosis not present

## 2020-12-30 ENCOUNTER — Ambulatory Visit: Payer: Self-pay

## 2020-12-30 ENCOUNTER — Other Ambulatory Visit: Payer: Self-pay

## 2020-12-30 DIAGNOSIS — Z0000 Encounter for general adult medical examination without abnormal findings: Secondary | ICD-10-CM

## 2020-12-30 LAB — POCT URINALYSIS DIPSTICK
Bilirubin, UA: NEGATIVE
Blood, UA: NEGATIVE
Glucose, UA: NEGATIVE
Ketones, UA: NEGATIVE
Leukocytes, UA: NEGATIVE
Nitrite, UA: NEGATIVE
Protein, UA: NEGATIVE
Spec Grav, UA: 1.025 (ref 1.010–1.025)
Urobilinogen, UA: 0.2 E.U./dL
pH, UA: 6 (ref 5.0–8.0)

## 2020-12-30 NOTE — Progress Notes (Signed)
Scheduled to complete physical 01/05/21 with Dr. Cecil Cobbs.  AMD

## 2021-01-01 LAB — CMP12+LP+TP+TSH+6AC+PSA+CBC…
ALT: 29 IU/L (ref 0–44)
AST: 19 IU/L (ref 0–40)
Albumin/Globulin Ratio: 1.7 (ref 1.2–2.2)
Albumin: 4.3 g/dL (ref 3.8–4.8)
Alkaline Phosphatase: 76 IU/L (ref 44–121)
BUN/Creatinine Ratio: 11 (ref 10–24)
BUN: 13 mg/dL (ref 8–27)
Basophils Absolute: 0 10*3/uL (ref 0.0–0.2)
Basos: 0 %
Bilirubin Total: 0.9 mg/dL (ref 0.0–1.2)
Calcium: 9 mg/dL (ref 8.6–10.2)
Chloride: 103 mmol/L (ref 96–106)
Chol/HDL Ratio: 3.7 ratio (ref 0.0–5.0)
Cholesterol, Total: 137 mg/dL (ref 100–199)
Creatinine, Ser: 1.19 mg/dL (ref 0.76–1.27)
EOS (ABSOLUTE): 0.2 10*3/uL (ref 0.0–0.4)
Eos: 3 %
Estimated CHD Risk: 0.6 times avg. (ref 0.0–1.0)
Free Thyroxine Index: 1.8 (ref 1.2–4.9)
GFR calc Af Amer: 75 mL/min/{1.73_m2} (ref >59)
GFR calc non Af Amer: 65 mL/min/{1.73_m2} (ref >59)
GGT: 21 IU/L (ref 0–65)
Globulin, Total: 2.5 g/dL (ref 1.5–4.5)
Glucose: 100 mg/dL — ABNORMAL HIGH (ref 65–99)
HDL: 37 mg/dL — ABNORMAL LOW (ref >39)
Hematocrit: 42.9 % (ref 37.5–51.0)
Hemoglobin: 14.5 g/dL (ref 13.0–17.7)
Immature Grans (Abs): 0 10*3/uL (ref 0.0–0.1)
Immature Granulocytes: 0 %
Iron: 88 ug/dL (ref 38–169)
LDH: 159 IU/L (ref 121–224)
LDL Chol Calc (NIH): 81 mg/dL (ref 0–99)
Lymphocytes Absolute: 1.6 10*3/uL (ref 0.7–3.1)
Lymphs: 24 %
MCH: 30.6 pg (ref 26.6–33.0)
MCHC: 33.8 g/dL (ref 31.5–35.7)
MCV: 91 fL (ref 79–97)
Monocytes Absolute: 0.7 10*3/uL (ref 0.1–0.9)
Monocytes: 10 %
Neutrophils Absolute: 4.3 10*3/uL (ref 1.4–7.0)
Neutrophils: 63 %
Phosphorus: 3.9 mg/dL (ref 2.8–4.1)
Platelets: 252 10*3/uL (ref 150–450)
Potassium: 4.3 mmol/L (ref 3.5–5.2)
Prostate Specific Ag, Serum: 1.5 ng/mL (ref 0.0–4.0)
RBC: 4.74 x10E6/uL (ref 4.14–5.80)
RDW: 12.9 % (ref 11.6–15.4)
Sodium: 139 mmol/L (ref 134–144)
T3 Uptake Ratio: 27 % (ref 24–39)
T4, Total: 6.5 ug/dL (ref 4.5–12.0)
TSH: 2.52 u[IU]/mL (ref 0.450–4.500)
Total Protein: 6.8 g/dL (ref 6.0–8.5)
Triglycerides: 99 mg/dL (ref 0–149)
Uric Acid: 5.2 mg/dL (ref 3.8–8.4)
VLDL Cholesterol Cal: 19 mg/dL (ref 5–40)
WBC: 6.9 10*3/uL (ref 3.4–10.8)

## 2021-01-01 LAB — TESTOSTERONE,FREE AND TOTAL
Testosterone, Free: 3.4 pg/mL — ABNORMAL LOW (ref 6.6–18.1)
Testosterone: 284 ng/dL (ref 264–916)

## 2021-01-04 DIAGNOSIS — G4701 Insomnia due to medical condition: Secondary | ICD-10-CM | POA: Diagnosis not present

## 2021-01-04 DIAGNOSIS — M549 Dorsalgia, unspecified: Secondary | ICD-10-CM | POA: Diagnosis not present

## 2021-01-04 DIAGNOSIS — G894 Chronic pain syndrome: Secondary | ICD-10-CM | POA: Diagnosis not present

## 2021-01-04 DIAGNOSIS — M1710 Unilateral primary osteoarthritis, unspecified knee: Secondary | ICD-10-CM | POA: Diagnosis not present

## 2021-01-04 DIAGNOSIS — G90522 Complex regional pain syndrome I of left lower limb: Secondary | ICD-10-CM | POA: Diagnosis not present

## 2021-01-04 DIAGNOSIS — G253 Myoclonus: Secondary | ICD-10-CM | POA: Diagnosis not present

## 2021-01-05 ENCOUNTER — Encounter: Payer: 59 | Admitting: Adult Medicine

## 2021-01-12 ENCOUNTER — Encounter: Payer: 59 | Admitting: Adult Medicine

## 2021-01-16 DIAGNOSIS — M1710 Unilateral primary osteoarthritis, unspecified knee: Secondary | ICD-10-CM | POA: Diagnosis not present

## 2021-01-16 DIAGNOSIS — G90522 Complex regional pain syndrome I of left lower limb: Secondary | ICD-10-CM | POA: Diagnosis not present

## 2021-01-16 DIAGNOSIS — G894 Chronic pain syndrome: Secondary | ICD-10-CM | POA: Diagnosis not present

## 2021-01-16 DIAGNOSIS — M549 Dorsalgia, unspecified: Secondary | ICD-10-CM | POA: Diagnosis not present

## 2021-01-18 ENCOUNTER — Ambulatory Visit: Payer: Self-pay | Admitting: Adult Medicine

## 2021-01-18 ENCOUNTER — Encounter: Payer: Self-pay | Admitting: Adult Medicine

## 2021-01-18 ENCOUNTER — Other Ambulatory Visit: Payer: Self-pay

## 2021-01-18 VITALS — BP 128/76 | HR 62 | Temp 98.4°F | Resp 12 | Ht 71.0 in | Wt 196.0 lb

## 2021-01-18 DIAGNOSIS — Z0000 Encounter for general adult medical examination without abnormal findings: Secondary | ICD-10-CM

## 2021-01-18 NOTE — Progress Notes (Signed)
HISTORY  Chief Complaint Annual Exam  HPI  Past Medical History:  Diagnosis Date  . Achilles tendon contracture, left 05/03/2016  . Bacterial meningitis 2006  . 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 Squamous- scalp  . Complication of anesthesia    Hyptension and Bradycardia  . Contracture of left ankle 05/03/2016  . CSF leak 2006  . Dysphagia   . Dysrhythmia    2 - 3 fast beats then returns to noraml  . GERD (gastroesophageal reflux disease)   . Headache    daily- post LP from tick bite- when standing  . Hyperlipidemia   . Hypotension   . Meningitis   . 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 Open left trimalleolar fracture 11/30/2015  . Pneumonia    10- 12 years ago.   Marland Kitchen. PONV (postoperative nausea and vomiting)    11/2015- no nausea- treated pre- op with Zofran  . Rocky Mountain spotted fever 2006  . Shingles   . Viral meningitis 2003  . Vitamin D insufficiency 12/02/2015    Patient Active Problem List   Diagnosis Date Noted  . GERD without esophagitis 04/14/2019  . Current use of proton pump inhibitor 04/14/2019  . Low bone mass 04/14/2019  . Sleep disorder 04/14/2019  . Complex regional pain syndrome type 1 of left lower extremity 07/01/2018  . Chronic venous insufficiency 04/14/2018  . Lymphedema 04/14/2018  . Swelling of limb 03/28/2018  . Pain in limb 03/28/2018  . Hypogonadism in male 04/10/2017  . Achilles tendon contracture, left 05/03/2016  . Contracture of left ankle 05/03/2016  . 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 Open fracture of left distal tibia 11/30/2015  . CSF leak   . Hypertension   . Dysphagia 05/31/2015  . Hx of adenomatous colonic polyps 05/31/2015    Past Surgical History:  Procedure Laterality Date  . ACHILLES TENDON LENGTHENING Left 05/01/2016   Procedure: LEFT ACHILLES TENDON LENGTHENING;  Surgeon: Myrene GalasMichael Handy, MD;  [facility];  Service:  Orthopedics;  Laterality: Left;  . ANKLE FRACTURE SURGERY Left 11/22/2015   Exteral fixation  . APPENDECTOMY  1970  . ARTHROSCOPIC REPAIR ACL Right   . CAPSULOTOMY Left 05/01/2016   Procedure: LEFT ANKLE CAPSULOTOMY, LEFT ANKLE MANIPULATION;  Surgeon: Myrene GalasMichael Handy, MD;  [facility];  Service: Orthopedics;  Laterality: Left;  . COLONOSCOPY    . COLONOSCOPY WITH PROPOFOL N/A 07/01/2015   Procedure: COLONOSCOPY WITH PROPOFOL;  Surgeon: Scot Junobert T Elliott, MD;  [facility];  Service: Endoscopy;  Laterality: N/A;  . ESOPHAGOGASTRODUODENOSCOPY    . ESOPHAGOGASTRODUODENOSCOPY N/A 07/01/2015   Procedure: ESOPHAGOGASTRODUODENOSCOPY (EGD);  Surgeon: Scot Junobert T Elliott, MD;  [facility];  Service: Endoscopy;  Laterality: N/A;  . EXTERNAL FIXATION LEG Left 11/22/2015   Procedure: EXTERNAL FIXATION LEFT  LEG AND IRRIGATION AND DEBRIDEMENT AND PINNING;  Surgeon: Jodi GeraldsJohn Graves, MD;  Location: [facility] OR;  Service: Orthopedics;  Laterality: Left;  . EXTERNAL FIXATION REMOVAL Left 11/28/2015   Procedure: REMOVAL EXTERNAL FIXATION LEG;  Surgeon: Myrene GalasMichael Handy, MD;  [facility];  Service: Orthopedics;  Laterality: Left;  . HARDWARE REMOVAL Left 05/01/2016   Procedure: HARDWARE REMOVAL LEFT DISTAL TIBIA WITH REVISION OF FIXATION, ;  Surgeon: Myrene GalasMichael Handy, MD;  Location: [facility] OR;  Service: Orthopedics;  Laterality: Left;  . KNEE ARTHROSCOPY WITH ANTERIOR CRUCIATE LIGAMENT (ACL) REPAIR Right 1988 ACL , 1992 ACL  .  LAMINECTOMY  [id number]   CSF leak- from LP   . ORIF TIBIA FRACTURE Left 11/28/2015   Procedure: OPEN REDUCTION INTERNAL FIXATION (ORIF)LEFT  TIBIA  ANKLE FRACTURE;  Surgeon: Altamese Brookville, MD;  Location: [place];  Service: Orthopedics;  Laterality: Left;  . SAVORY DILATION N/A 07/01/2015   Procedure: SAVORY DILATION;  Surgeon: Manya Silvas, MD;  Location: [facility] ENDOSCOPY;  Service: Endoscopy;  Laterality: N/A;  . skin cancer removal  2000-2001  . TIBIA IM NAIL INSERTION Left 05/01/2016    Procedure: LEFT TIBIA REPAIR WITH AUTOGRAFTING, LEFT REAMED INTRAMEDULLARY ASPIRATION;  Surgeon: Altamese Enoree, MD;  Location: [place];  Service: Orthopedics;  Laterality: Left;    Prior to Admission medications   Medication Sig Start Date End Date Taking? Authorizing Provider  alendronate (FOSAMAX) 70 MG tablet  04/10/18  Yes [provider]  atorvastatin (LIPITOR) 10 MG tablet TAKE 1 TABLET BY MOUTH DAILY 10/12/20  Yes Sable Feil, PA-C  baclofen (LIORESAL) 10 MG tablet Take 20 mg by mouth 3 (three) times daily. 11/17/19  Yes [provider]  gabapentin (NEURONTIN) 600 MG tablet Take 1 tablet (600 mg total) by mouth 3 (three) times daily. 11/18/19 02/16/20 Yes Betancourt, Aura Fey, NP  Gabapentin, Once-Daily, (GRALISE) 600 MG TABS Take by mouth.   Yes [provider]  omeprazole (PRILOSEC) 40 MG capsule Take 1 capsule (40 mg total) by mouth daily as needed (heartburn). 10/05/20 01/03/21 Yes Sable Feil, PA-C  tapentadol (NUCYNTA) 50 MG TABS tablet Take 1-2 tablets (50-100 mg total) by mouth every 6 (six) hours as needed for moderate pain or severe pain. 05/02/16  Yes Ainsley Spinner, PA-C  Testosterone 20.25 MG/ACT (1.62%) GEL Apply 2 Pump topically daily. 06/09/20  Yes Sable Feil, PA-C  cetirizine (ZYRTEC) 10 MG tablet Take 10 mg by mouth daily.    [provider]  Cholecalciferol 125 MCG (5000 UT) TABS Take 1 tablet by mouth daily. Patient not taking: Reported on 01/18/2021    [provider]  fluorouracil (EFUDEX) 5 % cream Apply 1 application topically daily as needed.    [provider]  Multiple Vitamin (MULTIVITAMIN) tablet Take 1 tablet by mouth daily.    [provider]  naloxone [facility]) nasal spray 4 mg/0.1 mL Place 1 spray into the nose once. 01/23/19   [provider]  NUCYNTA 100 MG TABS Take 1 tablet by mouth 2 (two) times daily. Patient not taking: Reported on 01/18/2021 05/31/20   [provider]   Testosterone 20.25 MG/ACT (1.62%) GEL APPLY 2 PUMPS TOPICALLY DAILY 11/12/20   Sable Feil, PA-C  Testosterone Enanthate (XYOSTED) 75 MG/0.5ML SOAJ Inject 75 mg into the skin once a week. 05/16/20   Stoioff, Ronda Fairly, MD    Allergies Tylenol [acetaminophen], Lodine [etodolac], and Prednisone  Family History  Problem Relation Age of Onset  . Cancer Mother     Social History Social History   Tobacco Use  . Smoking status: Never Smoker  . Smokeless tobacco: Never Used  Substance Use Topics  . Alcohol use: No  . Drug use: No    Review of Systems Constitutional: No fever/chills Eyes: No visual changes. ENT: No sore throat. Cardiovascular: Denies chest pain. Respiratory: Denies shortness of breath. Gastrointestinal: No abdominal pain.  No nausea, no vomiting.  No diarrhea.  No constipation. Genitourinary: Negative for dysuria. Musculoskeletal: Negative for back pain. Skin: Negative for rash. Neurological: Negative for headaches, focal weakness or numbness.  ______________________________________   PHYSICAL EXAM:  VITAL SIGNS: Constitutional: Alert and oriented. Well appearing and in no acute distress. Eyes: Conjunctivae are normal. PERRL. EOMI. Head: Atraumatic. Nose: No congestion/rhinnorhea. Mouth/Throat: Mucous membranes are moist.  Oropharynx non-erythematous.  No cervical lymphadenopathy. Cardiovascular: Normal rate, regular rhythm. Grossly normal heart sounds.  Good peripheral circulation. Respiratory: Normal respiratory effort.  No retractions. Lungs CTAB. Gastrointestinal: Soft and nontender. No distention. No abdominal bruits. No CVA tenderness. Musculoskeletal: No lower extremity tenderness nor edema.  No joint effusions. Neurologic:  Normal speech and language. No gross focal neurologic deficits are appreciated. No gait instability. Skin:  Skin is warm, dry and intact. No rash noted. Psychiatric: Mood and affect are normal. Speech and behavior are  normal. ______________________________________   LABS routin Component Ref Range & Units 2 wk ago  (12/30/20) 8 mo ago  (05/16/20) 12 mo ago  (01/21/20) 1 yr ago  (08/18/19) 4 yr ago  (05/03/16) 4 yr ago  (05/02/16) 4 yr ago  (05/02/16)  Glucose 65 - 99 mg/dL 100High   96   95   103High   Uric Acid 3.8 - 8.4 mg/dL 5.2   5.7 CM       Comment:      Therapeutic target for gout patients: <6.0  BUN 8 - 27 mg/dL 13   16   9  R   9 R   Creatinine, Ser 0.76 - 1.27 mg/dL 1.19   1.24   1.23 R   1.13 R   GFR calc non Af Amer >59 mL/min/1.73 65   61   >60 R   >60 R   GFR calc Af Amer >59 mL/min/1.73 75   71   >60 R, CM   >60 R, CM      BUN/Creatinine Ratio 10 - 24 11   13        Sodium 134 - 144 mmol/L 139   140   138 R   142 R   Potassium 3.5 - 5.2 mmol/L 4.3   4.5   3.7 R   4.0 R   Chloride 96 - 106 mmol/L 103   105   101 R   105 R   Calcium 8.6 - 10.2 mg/dL 9.0   9.1   9.0 R   8.8Low R   Phosphorus 2.8 - 4.1 mg/dL 3.9   3.3       Total Protein 6.0 - 8.5 g/dL 6.8   6.8       Albumin 3.8 - 4.8 g/dL 4.3   4.3       Globulin, Total 1.5 - 4.5 g/dL 2.5   2.5       Albumin/Globulin Ratio 1.2 - 2.2 1.7   1.7       Bilirubin Total 0.0 - 1.2 mg/dL 0.9   0.8       Alkaline Phosphatase 44 - 121 IU/L 76   77 R       Comment:       **Please note reference interval change**  LDH 121 - 224 IU/L 159   180       AST 0 - 40 IU/L 19   20       ALT 0 - 44 IU/L 29   22       GGT 0 - 65 IU/L 21   19       Iron 38 - 169 ug/dL 88   94       Cholesterol, Total 100 - 199 mg/dL 137   143  Triglycerides 0 - 149 mg/dL 99   105       HDL >39 mg/dL 37Low   34Low       VLDL Cholesterol Cal 5 - 40 mg/dL 19   20       LDL Chol Calc (NIH) 0 - 99 mg/dL 81   89       Chol/HDL Ratio 0.0 - 5.0 ratio 3.7   4.2 CM       Comment:                 T. Chol/HDL Ratio                        Men Women                 1/2 Avg.Risk 3.4  3.3                    Avg.Risk 5.0  4.4                 2X Avg.Risk 9.6  7.1                 3X Avg.Risk 23.4  11.0   Estimated CHD Risk 0.0 - 1.0 times avg. 0.6   0.8 CM       Comment: The CHD Risk is based on the T. Chol/HDL ratio. Other  factors affect CHD Risk such as hypertension, smoking,  diabetes, severe obesity, and family history of  premature CHD.   TSH 0.450 - 4.500 uIU/mL 2.520   1.610       T4, Total 4.5 - 12.0 ug/dL 6.5   6.0       T3 Uptake Ratio 24 - 39 % 27   22Low       Free Thyroxine Index 1.2 - 4.9 1.8   1.3       Prostate Specific Ag, Serum 0.0 - 4.0 ng/mL 1.5  0.6 CM  0.8 CM          WBC 3.4 - 10.8 x10E3/uL 6.9   5.8  6.3   9.3 R    RBC 4.14 - 5.80 x10E6/uL 4.74   5.13  5.01   3.99Low R    Hemoglobin 13.0 - 17.7 g/dL 14.5   15.8  14.9   11.5Low R, CM    Hematocrit 37.5 - 51.0 % 42.9   46.8  44.8   35.6Low R    MCV 79 - 97 fL 91   91  89   89.2 R    MCH 26.6 - 33.0 pg 30.6   30.8  29.7   28.8 R    MCHC 31.5 - 35.7 g/dL 33.8   33.8  33.3   32.3 R    RDW 11.6 - 15.4 % 12.9   12.6  12.5   13.4 R    Platelets 150 - 450 x10E3/uL 252   250  257   190 R    Neutrophils Not Estab. % 63   61  71      Lymphs Not Estab. % 24   24  17       Monocytes Not Estab. % 10   10  11       Eos Not Estab. % 3   4  1       Basos Not Estab. % 0   1  0      Neutrophils Absolute 1.4 - 7.0 x10E3/uL 4.3  3.6  4.4      Lymphocytes Absolute 0.7 - 3.1 x10E3/uL 1.6   1.4  1.1      Monocytes Absolute 0.1 - 0.9 x10E3/uL 0.7   0.6  0.7      EOS (ABSOLUTE) 0.0 - 0.4 x10E3/uL 0.2   0.2  0.1      Basophils Absolute 0.0 - 0.2 x10E3/uL 0.0   0.0  0.0      Immature Granulocytes Not Estab. % 0   0  0        Specialty Component Ref Range & Units 2 wk ago  (12/30/20) 8 mo ago  (05/16/20) 12 mo ago  (01/21/20) 1 yr ago  (04/21/19) 4 yr ago  (05/01/16) 4 yr ago  (05/01/16)  Testosterone 264 - 916 ng/dL 284  320 CM  189Low CM  285 CM   327Low R    Adult  male reference interval is based on a population of  healthy nonobese males (BMI <30) between 53 and 59 years old.  [place], [place] [phone]. PMID: [id number].   Testosterone, Free 6.6 - 18.1 pg/mL 3.4Low   1.4Low  4.2Low  8.3 R, CM      INITIAL IMPRESSION / ASSESSMENT AND PLAN Annual well visit without clinical finding Lab testo. Lo  Needs f/u psa  Hx of Lo hdl may add phosphotydlcholine eg lecithinClient request info for self care directed @Lo  hdl may add phosphotydlcholine eg to be discussed with new medicare pmd in 2-4wks

## 2021-01-18 NOTE — Progress Notes (Signed)
EKG & labs completed on 12/30/2020.  States he can take tylenol occasionally,just  AMD not regularly.

## 2021-04-03 ENCOUNTER — Other Ambulatory Visit: Payer: Self-pay | Admitting: Physician Assistant

## 2021-04-03 DIAGNOSIS — K219 Gastro-esophageal reflux disease without esophagitis: Secondary | ICD-10-CM

## 2021-04-15 DIAGNOSIS — R0781 Pleurodynia: Secondary | ICD-10-CM | POA: Diagnosis not present

## 2021-04-26 DIAGNOSIS — K1321 Leukoplakia of oral mucosa, including tongue: Secondary | ICD-10-CM | POA: Diagnosis not present

## 2021-05-23 DIAGNOSIS — E291 Testicular hypofunction: Secondary | ICD-10-CM | POA: Diagnosis not present

## 2021-05-23 DIAGNOSIS — M8580 Other specified disorders of bone density and structure, unspecified site: Secondary | ICD-10-CM | POA: Diagnosis not present

## 2021-05-23 DIAGNOSIS — Z5181 Encounter for therapeutic drug level monitoring: Secondary | ICD-10-CM | POA: Diagnosis not present

## 2021-05-23 DIAGNOSIS — G90529 Complex regional pain syndrome I of unspecified lower limb: Secondary | ICD-10-CM | POA: Diagnosis not present

## 2021-05-23 DIAGNOSIS — K219 Gastro-esophageal reflux disease without esophagitis: Secondary | ICD-10-CM | POA: Diagnosis not present

## 2021-05-23 DIAGNOSIS — M810 Age-related osteoporosis without current pathological fracture: Secondary | ICD-10-CM | POA: Diagnosis not present

## 2021-05-23 DIAGNOSIS — Z79899 Other long term (current) drug therapy: Secondary | ICD-10-CM | POA: Diagnosis not present

## 2021-07-17 ENCOUNTER — Other Ambulatory Visit: Payer: Self-pay

## 2021-07-17 DIAGNOSIS — K219 Gastro-esophageal reflux disease without esophagitis: Secondary | ICD-10-CM

## 2021-07-17 MED ORDER — OMEPRAZOLE 40 MG PO CPDR: 40.0000 mg | DELAYED_RELEASE_CAPSULE | Freq: Every day | ORAL | 2 refills | Status: DC | PRN

## 2021-10-06 DIAGNOSIS — Z23 Encounter for immunization: Secondary | ICD-10-CM

## 2022-03-02 ENCOUNTER — Ambulatory Visit: Payer: 59 | Admitting: Urology

## 2022-03-23 ENCOUNTER — Ambulatory Visit (INDEPENDENT_AMBULATORY_CARE_PROVIDER_SITE_OTHER): Payer: BC Managed Care – PPO | Admitting: Urology

## 2022-03-23 VITALS — BP 132/76 | HR 81 | Ht 71.0 in | Wt 192.0 lb

## 2022-03-23 DIAGNOSIS — E349 Endocrine disorder, unspecified: Secondary | ICD-10-CM | POA: Diagnosis not present

## 2022-03-23 DIAGNOSIS — Z125 Encounter for screening for malignant neoplasm of prostate: Secondary | ICD-10-CM

## 2022-03-24 LAB — TESTOSTERONE: Testosterone: 170 ng/dL — ABNORMAL LOW (ref 264–916)

## 2022-03-24 LAB — LUTEINIZING HORMONE: LH: 6 m[IU]/mL (ref 1.7–8.6)

## 2022-03-24 LAB — PSA: Prostate Specific Ag, Serum: 1 ng/mL (ref 0.0–4.0)

## 2022-03-25 ENCOUNTER — Encounter: Payer: Self-pay | Admitting: Urology

## 2022-03-25 MED ORDER — TESTOSTERONE 20.25 MG/ACT (1.62%) TD GEL: TRANSDERMAL | 1 refills | Status: DC

## 2022-03-25 NOTE — Progress Notes (Signed)
? ?03/23/2022 ?11:49 AM  ? ?Randy Bennett ?20-Dec-1956 ?[id number] ? ?Referring provider: No referring provider defined for this encounter. ? ?Chief Complaint  ?Patient presents with  ? Testosterone insufficiency  ? ? ?HPI: ?66 y.o. male presents for follow-up of hypogonadism. ? ?Refer to my prior note 05/16/2020.  He had been on TRT with AndroGel for approximately 2 years and levels never increased above the low normal range ?We discussed converting to injections and he elected Xyosted.  This was approved by his insurance however he states after reading side effects he was concerned about blood pressure elevation and elected not to start injections ?He has been off TRT ~ 1 year and realized that he felt much better even with low normal levels then presently ?Complains of extreme exhaustion, decreased energy and low libido ? ? ?PMH: ?Past Medical History:  ?Diagnosis Date  ? Achilles tendon contracture, left 05/03/2016  ? Bacterial meningitis 2006  ? Cancer [facility])   ? 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 Squamous- scalp  ? Complication of anesthesia   ? Hyptension and Bradycardia  ? Contracture of left ankle 05/03/2016  ? CSF leak 2006  ? Dysphagia   ? Dysrhythmia   ? 2 - 3 fast beats then returns to noraml  ? GERD (gastroesophageal reflux disease)   ? Headache   ? daily- post LP from tick bite- when standing  ? Hyperlipidemia   ? Hypotension   ? Meningitis   ? 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 Open left trimalleolar fracture 11/30/2015  ? Pneumonia   ? 10- 12 years ago.   ? PONV (postoperative nausea and vomiting)   ? 11/2015- no nausea- treated pre- op with Zofran  ? Rocky Mountain spotted fever 2006  ? Shingles   ? Viral meningitis 2003  ? Vitamin D insufficiency 12/02/2015  ? ? ?Surgical History: ?Past Surgical History:  ?Procedure Laterality Date  ? ACHILLES TENDON LENGTHENING Left 05/01/2016  ? Procedure: LEFT ACHILLES TENDON LENGTHENING;  Surgeon: Altamese Cromberg, MD;  Location: [place];  Service: Orthopedics;  Laterality: Left;  ? ANKLE FRACTURE SURGERY Left 11/22/2015  ? Exteral fixation  ? APPENDECTOMY  1970  ? ARTHROSCOPIC REPAIR ACL Right   ? CAPSULOTOMY Left 05/01/2016  ? Procedure: LEFT ANKLE CAPSULOTOMY, LEFT ANKLE MANIPULATION;  Surgeon: Altamese Lancaster, MD;  Location: [place];  Service: Orthopedics;  Laterality: Left;  ? COLONOSCOPY    ? COLONOSCOPY WITH PROPOFOL N/A 07/01/2015  ? Procedure: COLONOSCOPY WITH PROPOFOL;  Surgeon: Manya Silvas, MD;  Location[facility] ENDOSCOPY;  Service: Endoscopy;  Laterality: N/A;  ? ESOPHAGOGASTRODUODENOSCOPY    ? ESOPHAGOGASTRODUODENOSCOPY N/A 07/01/2015  ? Procedure: ESOPHAGOGASTRODUODENOSCOPY (EGD);  Surgeon: Manya Silvas, MD;  Location: [facility] ENDOSCOPY;  Service: Endoscopy;  Laterality: N/A;  ? EXTERNAL FIXATION LEG Left 11/22/2015  ? Procedure: EXTERNAL FIXATION LEFT  LEG AND IRRIGATION AND DEBRIDEMENT AND PINNING;  Surgeon: Dorna Leitz, MD;  Location: [place];  Service: Orthopedics;  Laterality: Left;  ? EXTERNAL FIXATION REMOVAL Left 11/28/2015  ? Procedure: REMOVAL EXTERNAL FIXATION LEG;  Surgeon: Altamese Egg Harbor, MD;  Location: [place];  Service: Orthopedics;  Laterality: Left;  ? HARDWARE REMOVAL Left 05/01/2016  ? Procedure: HARDWARE REMOVAL LEFT DISTAL TIBIA WITH REVISION OF FIXATION, ;  Surgeon: Altamese Trowbridge, MD;  Location: [place];  Service: Orthopedics;  Laterality: Left;  ? KNEE ARTHROSCOPY WITH ANTERIOR CRUCIATE LIGAMENT (ACL) REPAIR Right 1988 ACL , 1992 ACL  ? LAMINECTOMY  [id number]  ?  CSF leak- from LP   ? ORIF TIBIA FRACTURE Left 11/28/2015  ? Procedure: OPEN REDUCTION INTERNAL FIXATION (ORIF)LEFT  TIBIA  ANKLE FRACTURE;  Surgeon: Altamese Rentz, MD;  Location: [place];  Service: Orthopedics;  Laterality: Left;  ? SAVORY DILATION N/A 07/01/2015  ? Procedure: SAVORY DILATION;  Surgeon: Manya Silvas, MD;  Location: [facility] ENDOSCOPY;  Service: Endoscopy;  Laterality: N/A;  ? skin cancer removal  2000-2001  ? TIBIA IM NAIL INSERTION Left 05/01/2016  ?  Procedure: LEFT TIBIA REPAIR WITH AUTOGRAFTING, LEFT REAMED INTRAMEDULLARY ASPIRATION;  Surgeon: Altamese Mount Jewett, MD;  Location: [place];  Service: Orthopedics;  Laterality: Left;  ? ? ?Home Medications:  ?Allergies as of 03/23/2022   ? ?   Reactions  ? Tylenol [acetaminophen] Other (See Comments)  ? Headaches worsen with Tylenol due to CSF  ? Lodine [etodolac] Rash  ? Prednisone Rash  ? ?  ? ?  ?Medication List  ?  ? ?  ? Accurate as of March 23, 2022 11:59 PM. If you have any questions, ask your nurse or doctor.  ?  ?  ? ?  ? ?STOP taking these medications   ? ?Testosterone 20.25 MG/ACT (1.62%) Gel ?  ?Xyosted 49 MG/0.5ML Soaj ?Generic drug: Testosterone Enanthate ?  ? ?  ? ?TAKE these medications   ? ?alendronate 70 MG tablet ?Commonly known as: FOSAMAX ?  ?atorvastatin 10 MG tablet ?Commonly known as: LIPITOR ?TAKE 1 TABLET BY MOUTH DAILY ?  ?baclofen 10 MG tablet ?Commonly known as: LIORESAL ?Take 20 mg by mouth 3 (three) times daily. ?  ?cetirizine 10 MG tablet ?Commonly known as: ZYRTEC ?Take 10 mg by mouth daily. ?  ?Cholecalciferol 125 MCG (5000 UT) Tabs ?Take 1 tablet by mouth daily. ?  ?fluorouracil 5 % cream ?Commonly known as: EFUDEX ?Apply 1 application topically daily as needed. ?  ?gabapentin 600 MG tablet ?Commonly known as: NEURONTIN ?Take 1 tablet (600 mg total) by mouth 3 (three) times daily. ?  ?Gralise 600 MG Tabs ?Generic drug: Gabapentin (Once-Daily) ?Take by mouth. ?  ?multivitamin tablet ?Take 1 tablet by mouth daily. ?  ?omeprazole 40 MG capsule ?Commonly known as: PRILOSEC ?Take 1 capsule (40 mg total) by mouth daily as needed (heartburn). ?  ?tapentadol 50 MG tablet ?Commonly known as: NUCYNTA ?Take 1-2 tablets (50-100 mg total) by mouth every 6 (six) hours as needed for moderate pain or severe pain. ?  ?Nucynta 100 MG Tabs ?Generic drug: Tapentadol HCl ?Take 1 tablet by mouth 2 (two) times daily. ?  ? ?  ? ? ?Allergies:  ?Allergies  ?Allergen Reactions  ? Tylenol [Acetaminophen] Other (See  Comments)  ?  Headaches worsen with Tylenol due to CSF  ? Lodine [Etodolac] Rash  ? Prednisone Rash  ? ? ?Family History: ?Family History  ?Problem Relation Age of Onset  ? Cancer Mother   ? ? ?Social History:  reports that he has never smoked. He has never used smokeless tobacco. He reports that he does not drink alcohol and does not use drugs. ? ? ?Physical Exam: ?BP 132/76   Pulse 81   Ht '5\' 11"'$  (1.803 m)   Wt 192 lb (87.1 kg)   BMI 26.78 kg/m?   ?Constitutional:  Alert and oriented, No acute distress. ?HEENT: [place] AT, moist mucus membranes.  Trachea midline, no masses. ?Cardiovascular: No clubbing, cyanosis, or edema. ?Respiratory: Normal respiratory effort, no increased work of breathing. ?GI: Abdomen is soft, nontender, nondistended, no abdominal masses ?GU: Prostate  40 g, smooth without nodules ?Skin: No rashes, bruises or suspicious lesions. ?Neurologic: Grossly intact, no focal deficits, moving all 4 extremities. ?Psychiatric: Normal mood and affect. ? ? ?Assessment & Plan:   ? ?1.  Hypogonadism ?On record review last testosterone level was January 2022 and was 284 with a low free testosterone level of 3.4 pg/mL ?Last PSA May 2021 ?We discussed that side effects of topical testosterone are no different than injectable testosterone and that blood pressure elevations with TRT are typically not seen ?He would like to restart AndroGel.  His previous dose was never titrated above 2 pumps and will increase to 3 pumps daily ?Testosterone, LH and PSA were drawn today ?Lab visit for testosterone level after he has been been on TRT x6 weeks ?I had an extensive discussion regarding testosterone replacement therapy including the following: Treatment may result in improvements in erectile function, low sex drive, anemia, bone mineral density, lean body mass, and depressive symptoms; evidence is inconclusive whether testosterone therapy improves cognitive function, measures of diabetes, energy, fatigue, lipid profiles,  and quality of life measures; there is no conclusive evidence linking testosterone therapy to the development of prostate cancer; there is no definitive evidence linking testosterone therapy to a higher incidenc

## 2022-03-26 ENCOUNTER — Encounter: Payer: Self-pay | Admitting: *Deleted

## 2022-05-16 ENCOUNTER — Telehealth: Payer: Self-pay

## 2022-05-16 DIAGNOSIS — E349 Endocrine disorder, unspecified: Secondary | ICD-10-CM

## 2022-05-16 NOTE — Telephone Encounter (Signed)
Pt calls triage line and states that his RX for Testosterone is $45 at [facility], however he can get the RX at [facility] [address] for $25. He is requesting that we move RX there.   Pt was also overdue for lab visit, lab appt scheduled. Lab ordered.   Please advise.

## 2022-05-17 ENCOUNTER — Encounter: Payer: Self-pay | Admitting: *Deleted

## 2022-05-17 MED ORDER — TESTOSTERONE 20.25 MG/ACT (1.62%) TD GEL: TRANSDERMAL | 1 refills | Status: DC

## 2022-05-18 ENCOUNTER — Other Ambulatory Visit: Payer: PPO

## 2022-05-18 ENCOUNTER — Telehealth: Payer: Self-pay | Admitting: Urology

## 2022-05-18 DIAGNOSIS — E349 Endocrine disorder, unspecified: Secondary | ICD-10-CM

## 2022-05-18 NOTE — Telephone Encounter (Signed)
Pt was in office this morning for labwork and was asking about his testosterone.  He is almost out [facility] uses [facility] [address].  He would like for someone to give him a call.

## 2022-05-19 LAB — TESTOSTERONE: Testosterone: 143 ng/dL — ABNORMAL LOW (ref 264–916)

## 2022-05-20 ENCOUNTER — Telehealth: Payer: Self-pay | Admitting: Urology

## 2022-05-20 NOTE — Telephone Encounter (Signed)
Testosterone level is still low at 143.  Has he been applying AndroGel and if so how many pumps per day?

## 2022-05-22 ENCOUNTER — Encounter: Payer: Self-pay | Admitting: *Deleted

## 2022-05-22 NOTE — Telephone Encounter (Signed)
Returned call, left voice mail.

## 2022-05-24 ENCOUNTER — Telehealth: Payer: Self-pay | Admitting: Family Medicine

## 2022-05-24 NOTE — Telephone Encounter (Signed)
Patient notified Testosterone gel has been approved Approval date: 05/22/2022-05/22/2025

## 2022-08-10 ENCOUNTER — Telehealth: Payer: Self-pay | Admitting: Urology

## 2022-08-10 NOTE — Telephone Encounter (Signed)
Pt called office to find out if he needs to come in for another appt and/or labwork.  He doesn't have much of his testosterone gel left, only a few days.

## 2022-08-15 ENCOUNTER — Other Ambulatory Visit: Payer: Self-pay | Admitting: Urology

## 2022-08-17 NOTE — Telephone Encounter (Signed)
Patient notified and appointments have been made.  

## 2022-08-17 NOTE — Telephone Encounter (Signed)
Refill was sent.  Needs follow-up office visit with testosterone, hematocrit, PSA prior

## 2022-08-20 ENCOUNTER — Other Ambulatory Visit: Payer: Self-pay | Admitting: Family Medicine

## 2022-08-20 DIAGNOSIS — E349 Endocrine disorder, unspecified: Secondary | ICD-10-CM

## 2022-08-23 ENCOUNTER — Other Ambulatory Visit: Payer: BC Managed Care – PPO

## 2022-08-23 DIAGNOSIS — E349 Endocrine disorder, unspecified: Secondary | ICD-10-CM

## 2022-08-24 LAB — HEMATOCRIT: Hematocrit: 44.6 % (ref 37.5–51.0)

## 2022-08-24 LAB — PSA: Prostate Specific Ag, Serum: 0.6 ng/mL (ref 0.0–4.0)

## 2022-08-24 LAB — TESTOSTERONE: Testosterone: 318 ng/dL (ref 264–916)

## 2022-08-29 ENCOUNTER — Encounter: Payer: Self-pay | Admitting: Urology

## 2022-08-29 ENCOUNTER — Ambulatory Visit (INDEPENDENT_AMBULATORY_CARE_PROVIDER_SITE_OTHER): Payer: BC Managed Care – PPO | Admitting: Urology

## 2022-08-29 VITALS — BP 141/78 | HR 67 | Ht 71.0 in | Wt 198.0 lb

## 2022-08-29 DIAGNOSIS — E291 Testicular hypofunction: Secondary | ICD-10-CM

## 2022-08-29 DIAGNOSIS — E349 Endocrine disorder, unspecified: Secondary | ICD-10-CM

## 2022-08-29 NOTE — Progress Notes (Signed)
08/29/2022 3:58 PM   Randy Bennett 10-07-1956 [id number]  Referring provider: No referring provider defined for this encounter.  Chief Complaint  Patient presents with   Hypogonadism    HPI: 66 y.o. male presents for follow-up visit.  Prior history of hypogonadism on TRT never achieving good testosterone levels Berdine Addison was approved by his insurance though he elected not to start secondary to side effects Restarted on testosterone gel at 3 pumps daily April 2023 with complaints of extreme exhaustion, decreased energy and libido Has noted marked improvement in his symptoms.  Some increased tiredness in the evening but overall much better Labs 08/23/2022: Testosterone 318 ng/dL, HCT 44.6, PSA 0.6   PMH: Past Medical History:  Diagnosis Date   Achilles tendon contracture, left 05/03/2016   Bacterial meningitis 2006   Cancer ([employer])    basil cell skin 2000-2001- nasal . 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    PONV (postoperative nausea and vomiting)    11/2015- no nausea- treated pre- op with [place] spotted fever 2006   Shingles    Viral meningitis 2003   Vitamin D insufficiency 12/02/2015    Surgical History: Past Surgical History:  Procedure Laterality Date   ACHILLES TENDON LENGTHENING Left 05/01/2016   Procedure: LEFT ACHILLES TENDON LENGTHENING;  Surgeon: Altamese [place], MD;  Location: [place];  Service: Orthopedics;  Laterality: Left;   ANKLE FRACTURE SURGERY Left 11/22/2015   Exteral fixation    APPENDECTOMY  1970   ARTHROSCOPIC REPAIR ACL Right    CAPSULOTOMY Left 05/01/2016   Procedure: LEFT ANKLE CAPSULOTOMY, LEFT ANKLE MANIPULATION;  Surgeon: Altamese [place], MD;  Location: [place];  Service: Orthopedics;  Laterality: Left;   COLONOSCOPY     COLONOSCOPY WITH PROPOFOL N/A 07/01/2015   Procedure: COLONOSCOPY WITH PROPOFOL;  Surgeon: Manya Silvas, MD;  Location: [facility] ENDOSCOPY;  Service: Endoscopy;  Laterality: N/A;   ESOPHAGOGASTRODUODENOSCOPY     ESOPHAGOGASTRODUODENOSCOPY N/A 07/01/2015   Procedure: ESOPHAGOGASTRODUODENOSCOPY (EGD);  Surgeon: Manya Silvas, MD;  Location: [facility] ENDOSCOPY;  Service: Endoscopy;  Laterality: N/A;   EXTERNAL FIXATION LEG Left 11/22/2015   Procedure: EXTERNAL FIXATION LEFT  LEG AND IRRIGATION AND DEBRIDEMENT AND PINNING;  Surgeon: Dorna Leitz, MD;  Location: [place];  Service: Orthopedics;  Laterality: Left;   EXTERNAL FIXATION REMOVAL Left 11/28/2015   Procedure: REMOVAL EXTERNAL FIXATION LEG;  Surgeon: Altamese Abram, MD;  Location: [place];  Service: Orthopedics;  Laterality: Left;   HARDWARE REMOVAL Left 05/01/2016   Procedure: HARDWARE REMOVAL LEFT DISTAL TIBIA WITH REVISION OF FIXATION, ;  Surgeon: Altamese Aitkin, MD;  Location: [place];  Service: Orthopedics;  Laterality: Left;   KNEE ARTHROSCOPY WITH ANTERIOR CRUCIATE LIGAMENT (ACL) REPAIR Right 1988 ACL , 1992 ACL   LAMINECTOMY  [id number]   CSF leak- from LP    ORIF TIBIA FRACTURE Left 11/28/2015   Procedure: OPEN REDUCTION INTERNAL FIXATION (ORIF)LEFT  TIBIA  ANKLE FRACTURE;  Surgeon: Altamese [place], MD;  Location: [place];  Service: Orthopedics;  Laterality: Left;   SAVORY DILATION N/A 07/01/2015   Procedure: SAVORY DILATION;  Surgeon: Manya Silvas, MD;  [facility] END[facility]COPY;  Service: Endoscopy;  Laterality: N/A;   skin cancer removal  2000-2001   TIBIA IM NAIL INSERTION Left 05/01/2016   Procedure: LEFT TIBIA REPAIR WITH AUTOGRAFTING, LEFT REAMED INTRAMEDULLARY ASPIRATION;  Surgeon: Altamese [place],  MD;  Location: [place];  Service: Orthopedics;  Laterality: Left;    Home Medications:  Allergies as of 08/29/2022       Reactions   Lodine [etodolac] Rash   Prednisone Rash        Medication List        Accurate as of August 29, 2022  3:58 PM. If you have any questions, ask your nurse or doctor.          STOP taking these medications    fluorouracil 5 % cream Commonly known as: EFUDEX Stopped by: Abbie Sons, MD   Nucynta 100 MG Tabs Generic drug: Tapentadol HCl Stopped by: Abbie Sons, MD   tapentadol 50 MG tablet Commonly known as: NUCYNTA Stopped by: Abbie Sons, MD       TAKE these medications    atorvastatin 10 MG tablet Commonly known as: LIPITOR TAKE 1 TABLET BY MOUTH DAILY   baclofen 10 MG tablet Commonly known as: LIORESAL Take 10 mg by mouth daily.   cetirizine 10 MG tablet Commonly known as: ZYRTEC Take 10 mg by mouth daily.   gabapentin 600 MG tablet Commonly known as: NEURONTIN Take 1 tablet (600 mg total) by mouth 3 (three) times daily.   Gralise 600 MG Tabs Generic drug: Gabapentin (Once-Daily) Take by mouth.   HYDROmorphone 2 MG tablet Commonly known as: DILAUDID Take by mouth.   multivitamin tablet Take 1 tablet by mouth daily.   omeprazole 40 MG capsule Commonly known as: PRILOSEC Take 1 capsule (40 mg total) by mouth daily as needed (heartburn).   Testosterone 1.62 % Gel APPLY 3 TOTAL PUMPS DAILY AS DIRECTED        Allergies:  Allergies  Allergen Reactions   Lodine [Etodolac] Rash   Prednisone Rash    Family History: Family History  Problem Relation Age of Onset   Cancer Mother     Social History:  reports that he has never smoked. He has never used smokeless tobacco. He reports that he does not drink alcohol and does not use drugs.   Physical Exam: BP (!) 141/78   Pulse 67   Ht '5\' 11"'$  (1.803 m)   Wt 198 lb (89.8 kg)   BMI 27.62 kg/m   Constitutional:  Alert and oriented, No acute  distress. HEENT: [place] AT Respiratory: Normal respiratory effort, no increased work of breathing. GU: Prostate 40 g, smooth without nodules Skin: No rashes, bruises or suspicious lesions. Neurologic: Grossly intact, no focal deficits, moving all 4 extremities. Psychiatric: Normal mood and affect.  Laboratory Data:  Lab Results  Component Value Date   TESTOSTERONE 318 08/23/2022     Assessment & Plan:    1.  Hypogonadism Testosterone level low normal but marked symptom improvement Will increase to 2 pumps each shoulder daily  Follow-up lab visit for testosterone level in 6 weeks Lab visit 6 months testosterone, hematocrit Office visit 1 year for testosterone, hematocrit, PSA   Abbie Sons, MD  [facility] [address], [place]  [place], [place] [address] [phone]

## 2022-09-02 MED ORDER — TESTOSTERONE 1.62 % TD GEL
TRANSDERMAL | 1 refills | Status: DC
Start: 2022-09-02 — End: 2022-09-24

## 2022-09-22 ENCOUNTER — Other Ambulatory Visit: Payer: Self-pay | Admitting: Urology

## 2022-09-24 ENCOUNTER — Other Ambulatory Visit: Payer: Self-pay | Admitting: Family Medicine

## 2022-09-24 ENCOUNTER — Encounter: Payer: Self-pay | Admitting: Urology

## 2022-10-05 NOTE — Telephone Encounter (Signed)
Patient called the office today to clarify - when he was seen in the office on 9/6, Dr. Bernardo Heater increased his dosage to 2 pumps per shoulder daily (4 pumps per day).  1 bottle = 60 pumps At 4 pumps per day, prescription will only last 1/2 month  The prescription needs to be fixed and resent to the pharmacy.

## 2022-10-08 ENCOUNTER — Other Ambulatory Visit: Payer: Self-pay | Admitting: Urology

## 2022-10-08 MED ORDER — TESTOSTERONE 20.25 MG/ACT (1.62%) TD GEL: TRANSDERMAL | 1 refills | Status: DC

## 2022-10-10 ENCOUNTER — Other Ambulatory Visit: Payer: BC Managed Care – PPO

## 2022-10-11 ENCOUNTER — Other Ambulatory Visit: Payer: BC Managed Care – PPO

## 2022-10-11 DIAGNOSIS — E349 Endocrine disorder, unspecified: Secondary | ICD-10-CM

## 2022-10-12 ENCOUNTER — Telehealth: Payer: Self-pay | Admitting: *Deleted

## 2022-10-12 LAB — TESTOSTERONE: Testosterone: 493 ng/dL (ref 264–916)

## 2022-10-12 NOTE — Telephone Encounter (Signed)
-----   Message from Abbie Sons, MD sent at 10/12/2022  1:07 PM EDT ----- Testosterone much better at 493.  Continue present dose.

## 2022-10-12 NOTE — Telephone Encounter (Signed)
Notified patient as instructed, patient pleased. Discussed follow-up appointments, patient agrees  

## 2023-01-09 ENCOUNTER — Other Ambulatory Visit: Payer: Self-pay | Admitting: Urology

## 2023-02-28 ENCOUNTER — Other Ambulatory Visit: Payer: BC Managed Care – PPO

## 2023-02-28 DIAGNOSIS — E349 Endocrine disorder, unspecified: Secondary | ICD-10-CM

## 2023-03-01 ENCOUNTER — Telehealth: Payer: Self-pay | Admitting: *Deleted

## 2023-03-01 ENCOUNTER — Telehealth: Payer: Self-pay | Admitting: Urology

## 2023-03-01 LAB — HEMATOCRIT: Hematocrit: 47.9 % (ref 37.5–51.0)

## 2023-03-01 LAB — TESTOSTERONE: Testosterone: 1078 ng/dL — ABNORMAL HIGH (ref 264–916)

## 2023-03-01 NOTE — Telephone Encounter (Signed)
-----   Message from Nori Riis, PA-C sent at 03/01/2023 10:10 AM EST ----- That may explain the increase in the testosterone level.  We will see him in September.   ----- Message ----- From: Chrystie Nose, CMA Sent: 03/01/2023  10:07 AM EST To: Nori Riis, PA-C  Patient states he applied the gel the night before lab work.  ----- Message ----- From: Laneta Simmers Sent: 03/01/2023   8:33 AM EST To: [place]  When was his injection in relationship to his blood work?

## 2023-03-01 NOTE — Telephone Encounter (Signed)
Notified patient as instructed, patient will decrease to three pumps daily.

## 2023-03-01 NOTE — Telephone Encounter (Signed)
Left message on voice mail per DPR 

## 2023-03-01 NOTE — Telephone Encounter (Signed)
I would advise him to decrease his pump actuations to 3 pumps daily versus the 4.

## 2023-04-09 ENCOUNTER — Other Ambulatory Visit: Payer: Self-pay | Admitting: Urology

## 2023-04-24 ENCOUNTER — Other Ambulatory Visit: Payer: BC Managed Care – PPO

## 2023-04-25 ENCOUNTER — Other Ambulatory Visit: Payer: BC Managed Care – PPO

## 2023-04-25 DIAGNOSIS — E349 Endocrine disorder, unspecified: Secondary | ICD-10-CM

## 2023-04-26 ENCOUNTER — Encounter: Payer: Self-pay | Admitting: *Deleted

## 2023-04-26 LAB — TESTOSTERONE: Testosterone: 423 ng/dL (ref 264–916)

## 2023-06-12 ENCOUNTER — Other Ambulatory Visit: Payer: Self-pay | Admitting: Urology

## 2023-08-30 ENCOUNTER — Other Ambulatory Visit: Payer: BC Managed Care – PPO

## 2023-09-04 ENCOUNTER — Ambulatory Visit: Payer: BC Managed Care – PPO | Admitting: Urology

## 2023-09-12 ENCOUNTER — Other Ambulatory Visit: Payer: Self-pay | Admitting: *Deleted

## 2023-09-12 DIAGNOSIS — E349 Endocrine disorder, unspecified: Secondary | ICD-10-CM

## 2023-09-12 DIAGNOSIS — Z125 Encounter for screening for malignant neoplasm of prostate: Secondary | ICD-10-CM

## 2023-09-17 ENCOUNTER — Other Ambulatory Visit: Payer: BC Managed Care – PPO

## 2023-09-17 DIAGNOSIS — Z125 Encounter for screening for malignant neoplasm of prostate: Secondary | ICD-10-CM

## 2023-09-17 DIAGNOSIS — E349 Endocrine disorder, unspecified: Secondary | ICD-10-CM

## 2023-09-18 LAB — PSA: Prostate Specific Ag, Serum: 0.8 ng/mL (ref 0.0–4.0)

## 2023-09-18 LAB — HEMATOCRIT: Hematocrit: 45.8 % (ref 37.5–51.0)

## 2023-09-18 LAB — TESTOSTERONE: Testosterone: 376 ng/dL (ref 264–916)

## 2023-09-19 ENCOUNTER — Encounter: Payer: Self-pay | Admitting: Urology

## 2023-09-19 ENCOUNTER — Ambulatory Visit (INDEPENDENT_AMBULATORY_CARE_PROVIDER_SITE_OTHER): Payer: BC Managed Care – PPO | Admitting: Urology

## 2023-09-19 VITALS — BP 168/78 | HR 60 | Ht 71.0 in | Wt 195.0 lb

## 2023-09-19 DIAGNOSIS — E291 Testicular hypofunction: Secondary | ICD-10-CM | POA: Diagnosis not present

## 2023-09-19 DIAGNOSIS — Z125 Encounter for screening for malignant neoplasm of prostate: Secondary | ICD-10-CM

## 2023-09-19 MED ORDER — TESTOSTERONE 20.25 MG/ACT (1.62%) TD GEL: TRANSDERMAL | 3 refills | Status: DC

## 2023-11-12 ENCOUNTER — Other Ambulatory Visit: Payer: Self-pay | Admitting: Student

## 2023-11-12 DIAGNOSIS — R490 Dysphonia: Secondary | ICD-10-CM

## 2023-11-19 ENCOUNTER — Ambulatory Visit
Admission: RE | Admit: 2023-11-19 | Discharge: 2023-11-19 | Disposition: A | Discharge status: Home / Self Care | Payer: PPO | Source: Ambulatory Visit | Attending: Student

## 2023-11-19 DIAGNOSIS — R490 Dysphonia: Secondary | ICD-10-CM

## 2023-11-19 MED ORDER — IOPAMIDOL (ISOVUE-300) INJECTION 61%
75.0000 mL | Freq: Once | INTRAVENOUS | Status: AC | PRN
  Administered 2023-11-19: 75 mL via INTRAVENOUS

## 2024-02-25 ENCOUNTER — Other Ambulatory Visit: Payer: Self-pay | Admitting: Urology

## 2024-03-18 ENCOUNTER — Other Ambulatory Visit: Payer: Self-pay

## 2024-03-19 ENCOUNTER — Other Ambulatory Visit

## 2024-03-19 DIAGNOSIS — Z125 Encounter for screening for malignant neoplasm of prostate: Secondary | ICD-10-CM

## 2024-03-20 ENCOUNTER — Encounter: Payer: Self-pay | Admitting: Urology

## 2024-03-20 LAB — TESTOSTERONE: Testosterone: 558 ng/dL (ref 264–916)

## 2024-03-20 LAB — HEMOGLOBIN AND HEMATOCRIT, BLOOD
Hematocrit: 48.8 % (ref 37.5–51.0)
Hemoglobin: 15.9 g/dL (ref 13.0–17.7)

## 2024-06-18 ENCOUNTER — Encounter: Payer: Self-pay | Admitting: Urology

## 2024-06-24 ENCOUNTER — Other Ambulatory Visit: Payer: Self-pay | Admitting: Urology

## 2024-09-10 ENCOUNTER — Encounter

## 2024-09-14 ENCOUNTER — Other Ambulatory Visit: Payer: Self-pay | Admitting: *Deleted

## 2024-09-14 DIAGNOSIS — Z125 Encounter for screening for malignant neoplasm of prostate: Secondary | ICD-10-CM

## 2024-09-15 ENCOUNTER — Encounter: Payer: Self-pay | Admitting: Urology

## 2024-09-16 ENCOUNTER — Other Ambulatory Visit: Payer: Self-pay

## 2024-09-16 DIAGNOSIS — Z125 Encounter for screening for malignant neoplasm of prostate: Secondary | ICD-10-CM

## 2024-09-17 LAB — TESTOSTERONE: Testosterone: 682 ng/dL (ref 264–916)

## 2024-09-17 LAB — HEMOGLOBIN AND HEMATOCRIT, BLOOD
Hematocrit: 47.5 % (ref 37.5–51.0)
Hemoglobin: 15.4 g/dL (ref 13.0–17.7)

## 2024-09-17 LAB — PSA: Prostate Specific Ag, Serum: 3.1 ng/mL (ref 0.0–4.0)

## 2024-09-18 ENCOUNTER — Ambulatory Visit: Payer: Self-pay | Admitting: Urology

## 2024-09-18 ENCOUNTER — Ambulatory Visit: Admitting: Urology

## 2024-09-22 ENCOUNTER — Ambulatory Visit (INDEPENDENT_AMBULATORY_CARE_PROVIDER_SITE_OTHER): Admitting: Physician Assistant

## 2024-09-22 VITALS — BP 146/85 | HR 69 | Ht 72.0 in | Wt 204.0 lb

## 2024-09-22 DIAGNOSIS — E291 Testicular hypofunction: Secondary | ICD-10-CM

## 2024-09-22 DIAGNOSIS — R972 Elevated prostate specific antigen [PSA]: Secondary | ICD-10-CM | POA: Diagnosis not present

## 2024-09-22 MED ORDER — TESTOSTERONE 20.25 MG/ACT (1.62%) TD GEL: TRANSDERMAL | 3 refills | Status: AC

## 2024-09-22 NOTE — Progress Notes (Signed)
 09/22/2024 5:19 PM   Randy Bennett [birth year] [id number]  CC: Chief Complaint  Patient presents with   Follow-up   Hypogonadism   HPI: Randy Bennett is a 68 y.o. male with PMH hypogonadism on testosterone  gel who presents today for annual follow-up.   Today he reports significant improvement in daytime sleepiness on testosterone  gel.  He feels much better and is very pleased with his current regimen.  He has noticed an enormous difference between his baseline and his energy level with therapeutic testosterone  in the 500-600 range.  Most recent labs dated 09/16/2024 show stable hematocrit, 47.5, therapeutic testosterone , 682, and increased PSA, 3.1, previously 0.8 1 year ago.  PMH: Past Medical History:  Diagnosis Date   Achilles tendon contracture, left 05/03/2016   Bacterial meningitis 2006   Cancer ([facility])    basil cell skin 2000-2001- nasal . 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    PONV (postoperative nausea and vomiting)    11/2015- no nausea- treated pre- op with Zofran    [facility] spotted fever 2006   Shingles    Viral meningitis 2003   Vitamin D  insufficiency 12/02/2015    Surgical History: Past Surgical History:  Procedure Laterality Date   ACHILLES TENDON LENGTHENING Left 05/01/2016   Procedure: LEFT ACHILLES TENDON LENGTHENING;  Surgeon: Randy Bruch, MD;  Location: [facility] OR;  Service: Orthopedics;  Laterality: Left;   ANKLE FRACTURE SURGERY Left 11/22/2015   Exteral fixation   APPENDECTOMY   1970   ARTHROSCOPIC REPAIR ACL Right    CAPSULOTOMY Left 05/01/2016   Procedure: LEFT ANKLE CAPSULOTOMY, LEFT ANKLE MANIPULATION;  Surgeon: Randy Bruch, MD;  Location: [facility] OR;  Service: Orthopedics;  Laterality: Left;   COLONOSCOPY     COLONOSCOPY WITH PROPOFOL  N/A 07/01/2015   Procedure: COLONOSCOPY WITH PROPOFOL ;  Surgeon: Lamar ONEIDA Holmes, MD;  Location: [facility] ENDOSCOPY;  Service: Endoscopy;  Laterality: N/A;   ESOPHAGOGASTRODUODENOSCOPY     ESOPHAGOGASTRODUODENOSCOPY N/A 07/01/2015   Procedure: ESOPHAGOGASTRODUODENOSCOPY (EGD);  Surgeon: Lamar ONEIDA Holmes, MD;  Location[facility] ENDOSCOPY;  Service: Endoscopy;  Laterality: N/A;   EXTERNAL FIXATION LEG Left 11/22/2015   Procedure: EXTERNAL FIXATION LEFT  LEG AND IRRIGATION AND DEBRIDEMENT AND PINNING;  Surgeon: Norleen Gavel, MD;  Location: [facility] OR;  Service: Orthopedics;  Laterality: Left;   EXTERNAL FIXATION REMOVAL Left 11/28/2015   Procedure: REMOVAL EXTERNAL FIXATION LEG;  Surgeon: Randy Bruch, MD;  Location: [facility] OR;  Service: Orthopedics;  Laterality: Left;   HARDWARE REMOVAL Left 05/01/2016   Procedure: HARDWARE REMOVAL LEFT DISTAL TIBIA WITH REVISION OF FIXATION, ;  Surgeon: Randy Bruch, MD;  Location: [facility] OR;  Service: Orthopedics;  Laterality: Left;   KNEE ARTHROSCOPY WITH ANTERIOR CRUCIATE LIGAMENT (ACL) REPAIR Right 1988 ACL , 1992 ACL   LAMINECTOMY  [id number]   CSF leak- from LP    ORIF TIBIA FRACTURE Left 11/28/2015   Procedure: OPEN REDUCTION INTERNAL FIXATION (ORIF)LEFT  TIBIA  ANKLE FRACTURE;  Surgeon: Randy Bruch, MD;  Location: [facility] OR;  Service: Orthopedics;  Laterality: Left;   SAVORY DILATION N/A 07/01/2015   Procedure: SAVORY DILATION;  Surgeon: Lamar ONEIDA Holmes, MD;  Location: [facility] ENDOSCOPY;  Service: Endoscopy;  Laterality: N/A;   skin cancer removal  2000-2001   TIBIA IM NAIL INSERTION Left 05/01/2016   Procedure: LEFT TIBIA REPAIR WITH AUTOGRAFTING, LEFT REAMED INTRAMEDULLARY ASPIRATION;  Surgeon: Randy Bruch, MD;  Location: [facility]  OR;  Service: Orthopedics;  Laterality: Left;    Home Medications:  Allergies as of 09/22/2024       Reactions   Lodine [etodolac] Rash   Prednisone Rash        Medication List        Accurate as of September 22, 2024  5:19 PM. If you have any questions, ask your nurse or doctor.          STOP taking these medications    baclofen 10 MG tablet Commonly known as: LIORESAL Stopped by: Muzammil Bruins       TAKE these medications    cetirizine 10 MG tablet Commonly known as: ZYRTEC Take 10 mg by mouth daily.   gabapentin  600 MG tablet Commonly known as: NEURONTIN  Take 600 mg by mouth 4 (four) times daily.   morphine 15 MG tablet Commonly known as: MSIR Take 15 mg by mouth 3 (three) times daily as needed. Start taking on: September 24, 2024   morphine 30 MG 12 hr tablet Commonly known as: MS CONTIN Take 30 mg by mouth at bedtime. Start taking on: October 09, 2024   multivitamin tablet Take 1 tablet by mouth daily.   Testosterone  20.25 MG/ACT (1.62%) Gel APPLY 4 TOTAL PUMPS DAILY AS DIRECTED   venlafaxine XR 37.5 MG 24 hr capsule Commonly known as: EFFEXOR-XR Take by mouth.        Allergies:  Allergies  Allergen Reactions   Lodine [Etodolac] Rash   Prednisone Rash    Family History: Family History  Problem Relation Age of Onset   Cancer Mother     Social History:   reports that he has never smoked. He has never used smokeless tobacco. He reports that he does not drink alcohol and does not use drugs.  Physical Exam: BP (!) 146/85 (BP Location: Left Arm, Patient Position: Sitting, Cuff Size: Large)   Pulse 69   Ht 6' (1.829 m)   Wt 204 lb (92.5 kg)   SpO2 97%   BMI 27.67 kg/m   Constitutional:  Alert and oriented, no acute distress, nontoxic appearing HEENT: [place], AT Cardiovascular: No clubbing, cyanosis, or edema Respiratory: Normal respiratory effort, no increased work of breathing Skin: No rashes, bruises or suspicious  lesions Neurologic: Grossly intact, no focal deficits, moving all 4 extremities Psychiatric: Normal mood and affect  Laboratory Data: Results for orders placed or performed in visit on 09/16/24  PSA   Collection Time: 09/16/24  9:23 AM  Result Value Ref Range   Prostate Specific Ag, Serum 3.1 0.0 - 4.0 ng/mL  Testosterone    Collection Time: 09/16/24  9:23 AM  Result Value Ref Range   Testosterone  682 264 - 916 ng/dL  Hemoglobin and Hematocrit, Blood   Collection Time: 09/16/24  9:23 AM  Result Value Ref Range   Hemoglobin 15.4 13.0 - 17.7 g/dL   Hematocrit 52.4 62.4 - 51.0 %   Assessment & Plan:   1. Hypogonadism in male (Primary) Symptoms extremely well-controlled on topical testosterone  gel 4 pumps  daily, will continue this.  He is hesitant to go off the testosterone  even temporarily. - Testosterone  20.25 MG/ACT (1.62%) GEL; APPLY 4 TOTAL PUMPS DAILY AS DIRECTED  Dispense: 150 g; Refill: 3  2. Rising PSA level Possibly secondary to TRT as above, however he has noticed such a marked difference in his energy levels on TRT that he is hesitant to stop it for several weeks to allow for PSA to return to baseline.  We elected to pursue MR prostate as an alternative and I will contact him with results. - MR PROSTATE W WO CONTRAST; Future  Return for Will call with results.  Lucie Hones, PA-C  [facility]  [address] [place], [place] [address] [phone]

## 2024-09-29 ENCOUNTER — Ambulatory Visit
Admission: RE | Admit: 2024-09-29 | Discharge: 2024-09-29 | Disposition: A | Discharge status: Home / Self Care | Source: Ambulatory Visit | Attending: Physician Assistant | Admitting: Physician Assistant

## 2024-09-29 DIAGNOSIS — R972 Elevated prostate specific antigen [PSA]: Secondary | ICD-10-CM | POA: Insufficient documentation

## 2024-09-29 MED ORDER — GADOBUTROL 1 MMOL/ML IV SOLN
9.0000 mL | Freq: Once | INTRAVENOUS | Status: AC | PRN
  Administered 2024-09-29: 9 mL via INTRAVENOUS

## 2024-10-01 ENCOUNTER — Ambulatory Visit: Payer: Self-pay | Admitting: Physician Assistant

## 2024-10-02 ENCOUNTER — Other Ambulatory Visit: Payer: Self-pay

## 2024-10-02 DIAGNOSIS — Z125 Encounter for screening for malignant neoplasm of prostate: Secondary | ICD-10-CM

## 2024-10-02 DIAGNOSIS — E291 Testicular hypofunction: Secondary | ICD-10-CM

## 2024-10-02 DIAGNOSIS — E349 Endocrine disorder, unspecified: Secondary | ICD-10-CM

## 2024-12-10 ENCOUNTER — Encounter

## 2024-12-31 ENCOUNTER — Other Ambulatory Visit

## 2024-12-31 DIAGNOSIS — E291 Testicular hypofunction: Secondary | ICD-10-CM

## 2024-12-31 DIAGNOSIS — Z125 Encounter for screening for malignant neoplasm of prostate: Secondary | ICD-10-CM

## 2024-12-31 DIAGNOSIS — E349 Endocrine disorder, unspecified: Secondary | ICD-10-CM

## 2025-01-01 ENCOUNTER — Ambulatory Visit: Payer: Self-pay | Admitting: Physician Assistant

## 2025-01-01 LAB — TESTOSTERONE: Testosterone: 574 ng/dL (ref 264–916)

## 2025-01-01 LAB — HEMOGLOBIN AND HEMATOCRIT, BLOOD
Hematocrit: 47.3 % (ref 37.5–51.0)
Hemoglobin: 15.5 g/dL (ref 13.0–17.7)

## 2025-01-01 LAB — PSA: Prostate Specific Ag, Serum: 2 ng/mL (ref 0.0–4.0)

## 2025-01-04 ENCOUNTER — Other Ambulatory Visit: Payer: Self-pay

## 2025-01-04 DIAGNOSIS — E291 Testicular hypofunction: Secondary | ICD-10-CM

## 2025-01-04 DIAGNOSIS — R972 Elevated prostate specific antigen [PSA]: Secondary | ICD-10-CM

## 2025-01-11 ENCOUNTER — Encounter: Admission: RE | Payer: Self-pay | Source: Home / Self Care

## 2025-01-11 ENCOUNTER — Ambulatory Visit: Admit: 2025-01-11

## 2025-01-11 SURGERY — COLONOSCOPY
Anesthesia: General

## 2025-04-05 ENCOUNTER — Other Ambulatory Visit

## 2025-06-28 ENCOUNTER — Other Ambulatory Visit

## 2025-07-05 ENCOUNTER — Ambulatory Visit: Admitting: Physician Assistant
